# Patient Record
Sex: Male | Born: 1975
Health system: Southern US, Community
[De-identification: ages and names within clinical notes are randomized; demographics above are authoritative.]

## PROBLEM LIST (undated history)

## (undated) DIAGNOSIS — K30 Functional dyspepsia: Secondary | ICD-10-CM

## (undated) DIAGNOSIS — F419 Anxiety disorder, unspecified: Secondary | ICD-10-CM

## (undated) DIAGNOSIS — S0460XA Injury of acoustic nerve, unspecified side, initial encounter: Secondary | ICD-10-CM

## (undated) DIAGNOSIS — L719 Rosacea, unspecified: Secondary | ICD-10-CM

## (undated) DIAGNOSIS — K580 Irritable bowel syndrome with diarrhea: Secondary | ICD-10-CM

## (undated) DIAGNOSIS — H8120 Vestibular neuronitis, unspecified ear: Secondary | ICD-10-CM

## (undated) DIAGNOSIS — J309 Allergic rhinitis, unspecified: Secondary | ICD-10-CM

## (undated) DIAGNOSIS — K760 Fatty (change of) liver, not elsewhere classified: Secondary | ICD-10-CM

## (undated) DIAGNOSIS — L709 Acne, unspecified: Secondary | ICD-10-CM

## (undated) DIAGNOSIS — E785 Hyperlipidemia, unspecified: Secondary | ICD-10-CM

## (undated) HISTORY — DX: Functional dyspepsia: K30

## (undated) HISTORY — DX: Acne, unspecified: L70.9

## (undated) HISTORY — DX: Injury of acoustic nerve, unspecified side, initial encounter: S04.60XA

## (undated) HISTORY — PX: TONSILLECTOMY: SUR1361

## (undated) HISTORY — DX: Hyperlipidemia, unspecified: E78.5

## (undated) HISTORY — DX: Allergic rhinitis, unspecified: J30.9

## (undated) HISTORY — DX: Irritable bowel syndrome with diarrhea: K58.0

## (undated) HISTORY — DX: Fatty (change of) liver, not elsewhere classified: K76.0

## (undated) HISTORY — DX: Rosacea, unspecified: L71.9

## (undated) HISTORY — DX: Vestibular neuronitis, unspecified ear: H81.20

## (undated) HISTORY — DX: Anxiety disorder, unspecified: F41.9

---

## 2007-05-09 ENCOUNTER — Ambulatory Visit: Payer: Self-pay | Admitting: Gastroenterology

## 2007-05-09 LAB — CONVERTED CEMR LAB
ALT: 91 units/L — ABNORMAL HIGH (ref 0–40)
AST: 41 units/L — ABNORMAL HIGH (ref 0–37)
Albumin: 4.4 g/dL (ref 3.5–5.2)
Alkaline Phosphatase: 53 units/L (ref 39–117)
Anti Nuclear Antibody(ANA): NEGATIVE
Bilirubin, Direct: 0.1 mg/dL (ref 0.0–0.3)
Ceruloplasmin: 34 mg/dL (ref 21–63)
Ferritin: 206.1 ng/mL (ref 22.0–322.0)
HCV Ab: NEGATIVE
Hep B S Ab: NEGATIVE
Hepatitis B Surface Ag: NEGATIVE
INR: 1.2 (ref 0.9–2.0)
Iron: 69 ug/dL (ref 42–165)
Prothrombin Time: 13.3 s (ref 10.0–14.0)
Total Bilirubin: 0.6 mg/dL (ref 0.3–1.2)
Total Protein: 7.7 g/dL (ref 6.0–8.3)
Transferrin: 252.5 mg/dL (ref >=212.0)

## 2007-05-10 ENCOUNTER — Ambulatory Visit: Payer: Self-pay | Admitting: Gastroenterology

## 2007-06-07 ENCOUNTER — Ambulatory Visit: Payer: Self-pay | Admitting: Gastroenterology

## 2008-05-10 DIAGNOSIS — F410 Panic disorder [episodic paroxysmal anxiety] without agoraphobia: Secondary | ICD-10-CM | POA: Insufficient documentation

## 2008-05-10 DIAGNOSIS — J309 Allergic rhinitis, unspecified: Secondary | ICD-10-CM | POA: Insufficient documentation

## 2008-05-10 DIAGNOSIS — E782 Mixed hyperlipidemia: Secondary | ICD-10-CM | POA: Insufficient documentation

## 2011-05-11 NOTE — Assessment & Plan Note (Signed)
[facility]                         GASTROENTEROLOGY OFFICE NOTE   Divante, Kotch Rosetta Posner                       MRN:          [id number]  DATE:06/07/2007                            DOB:          [birth year]    Mr. Conover returns for followup on fatty liver and abnormal liver function  tests. He saw Dr. Theresia Lo on May 30 and a repeat hepatic function panel  showed an AST of 56 and an ALT of 113 with the remainder of the liver  function tests being normal. This is in a similary range as his prior  liver function tests. We reviewed the results of his recent ultrasound  compared to his prior ultrasound and the results of all his blood work  performed.   CURRENT MEDICATIONS:  Listed on the chart - updated and reviewed.   MEDICATION ALLERGIES:  None known.   PHYSICAL EXAMINATION:  GENERAL:  Overweight, white male in no acute  distress.  VITAL SIGNS:  Weight 231 pounds, blood pressure is 110/72, pulse 84 and  regular. He is not reexamined.   ASSESSMENT/PLAN:  Abnormal transaminases with moderate to severely  increased hepatic echodensity and mild hepatomegaly noted on his recent  ultrasound. These findings are all compatible with fatty infiltration of  the liver. Medication side effects would be much less likely. Other  infiltrative diseases of the liver are possible but less likely. He is  very concerned that a diagnosis has not been adequately established and  he would like a second opinion at [facility] and I encouraged him to proceed with a second opinion. I have  offered him the option of proceeding with a percutaneous ultrasound-  guided liver biopsy to further assess his liver disease and at this  point he would like to proceed with the second opinion. I will await the  results of his consultation at [facility].     Venita Lick. Russella Dar, MD, [facility]  Electronically Signed    MTS/MedQ  DD:  06/07/2007  DT: 06/07/2007  Job #: [id number]   cc:   Vikki Ports, M.D.

## 2011-05-11 NOTE — Assessment & Plan Note (Signed)
[facility]                         GASTROENTEROLOGY OFFICE NOTE   JAMEY, HARMAN                       MRN:          [id number]  DATE:05/09/2007                            DOB:          [birth year]    REFERRING PHYSICIAN:  Vikki Ports, M.D.   REASON FOR REFERRAL:  Abnormal liver function tests.   HISTORY OF PRESENT ILLNESS:  Mr. Tsutsui is a 35 year old white male whom I  previously evaluated in 2001 and 2003.  Fatty infiltration of the liver  as well as mild hepatomegaly was noted on ultrasound imaging in 2001.  The liver measured 19 cm and a diffuse increase in hepatic echodensity  was noted.  Serologies for chronic viral hepatitis and metabolic liver  diseases were all negative.  He was overweight  and he was recommended  to begin a long-term weight loss program supervised by Dr. Smith Mince.  He has recently transferred his care to Dr. Theresia Lo, who has noted  persistently elevated liver function tests.  Blood work from March 12  showed an AST of 50 and an ALT of 122.  His indirect bilirubin was  elevated at 1.1.  Total cholesterol at 218.  Repeat blood work from  April 22 shows an AST of 55, and an ALT of 116.  There is no family  history of liver disease and he denies any exposure to patients with  known hepatitis or jaundice.  He denies tattoos, intravenous drug usage,  inadvertant needle sticks, or blood transfusions.  He states he has  started on medications for hyperlipidemia and his cholesterol has  improved.  He states his weight has been essentially stable over the  past several years.   FAMILY HISTORY:  Remarkable for colon polyps in his father.  No other  family members with colon polyps, colon cancer, or inflammatory bowel  disease.   PAST MEDICAL HISTORY:  Hyperlipidemia, panic disorder, seasonal  rhinitis, status post wisdom tooth extraction, fatty infiltration of the  liver.   CURRENT MEDICATIONS:  Listed on the chart,  updated, and reviewed.   MEDICATION ALLERGIES:  None known.   SOCIAL HISTORY AND REVIEW OF SYSTEMS:  Per the hand-written form.   PHYSICAL EXAM:  Well-developed, well-nourished overweight white male in  no acute distress.  Height 5 feet 7 inches, weight 232 pounds, blood pressure 120/82, pulse  72 and regular.  HEENT:  Anicteric sclerae.  Oropharynx clear.  CHEST:  Clear to auscultation bilaterally.  CARDIAC:  Regular rate and rhythm without murmurs appreciated.  ABDOMEN:  Soft, nontender, nondistended.  Normoactive bowel sounds.  No  palpable organomegaly, masses, or hernias.  The liver span is  approximately 12-13 cm by percussion in the right mid clavicular line.  NEUROLOGIC:  Alert and oriented x3.  Grossly nonfocal.   ASSESSMENT AND PLAN:  1. Abnormal transaminases likley secondary to known hepatic steatosis.      Will schedule an abdominal ultrasound and repeat all of his      serologic studies to reassess his liver disease.  Consider a liver      biopsy for further evaluation.  If  hepatic steatosis is again      confirmed, he should begin a long-term weight loss program with      ongoing management of his hyperlipidemia per Dr. Theresia Lo.  Return      office visit in 1 month.     Venita Lick. Russella Dar, MD, [facility]  Electronically Signed    MTS/MedQ  DD: 05/09/2007  DT: 05/09/2007  Job #: [id number]   cc:   Vikki Ports, M.D.

## 2012-09-22 ENCOUNTER — Encounter: Payer: Self-pay | Admitting: Gastroenterology

## 2013-03-30 ENCOUNTER — Encounter: Payer: Self-pay | Admitting: Gastroenterology

## 2014-02-19 DIAGNOSIS — N62 Hypertrophy of breast: Secondary | ICD-10-CM | POA: Insufficient documentation

## 2014-02-19 DIAGNOSIS — E230 Hypopituitarism: Secondary | ICD-10-CM | POA: Insufficient documentation

## 2014-02-19 DIAGNOSIS — T50905A Adverse effect of unspecified drugs, medicaments and biological substances, initial encounter: Secondary | ICD-10-CM | POA: Insufficient documentation

## 2014-06-07 DIAGNOSIS — L719 Rosacea, unspecified: Secondary | ICD-10-CM | POA: Insufficient documentation

## 2014-06-07 HISTORY — DX: Rosacea, unspecified: L71.9

## 2014-10-21 DIAGNOSIS — Z6835 Body mass index (BMI) 35.0-35.9, adult: Secondary | ICD-10-CM

## 2014-10-21 DIAGNOSIS — E6609 Other obesity due to excess calories: Secondary | ICD-10-CM | POA: Insufficient documentation

## 2016-09-27 DIAGNOSIS — M791 Myalgia: Secondary | ICD-10-CM | POA: Diagnosis not present

## 2016-09-27 DIAGNOSIS — M7542 Impingement syndrome of left shoulder: Secondary | ICD-10-CM | POA: Diagnosis not present

## 2016-09-27 DIAGNOSIS — M25512 Pain in left shoulder: Secondary | ICD-10-CM | POA: Diagnosis not present

## 2016-10-08 DIAGNOSIS — M25512 Pain in left shoulder: Secondary | ICD-10-CM | POA: Diagnosis not present

## 2016-10-08 DIAGNOSIS — M7542 Impingement syndrome of left shoulder: Secondary | ICD-10-CM | POA: Diagnosis not present

## 2016-10-08 DIAGNOSIS — M791 Myalgia: Secondary | ICD-10-CM | POA: Diagnosis not present

## 2016-10-19 DIAGNOSIS — Z6835 Body mass index (BMI) 35.0-35.9, adult: Secondary | ICD-10-CM | POA: Diagnosis not present

## 2016-10-19 DIAGNOSIS — G4733 Obstructive sleep apnea (adult) (pediatric): Secondary | ICD-10-CM | POA: Diagnosis not present

## 2016-10-19 DIAGNOSIS — E6609 Other obesity due to excess calories: Secondary | ICD-10-CM | POA: Diagnosis not present

## 2016-10-20 DIAGNOSIS — D1801 Hemangioma of skin and subcutaneous tissue: Secondary | ICD-10-CM | POA: Diagnosis not present

## 2016-10-20 DIAGNOSIS — B001 Herpesviral vesicular dermatitis: Secondary | ICD-10-CM | POA: Diagnosis not present

## 2016-10-20 DIAGNOSIS — L718 Other rosacea: Secondary | ICD-10-CM | POA: Diagnosis not present

## 2016-10-20 DIAGNOSIS — D485 Neoplasm of uncertain behavior of skin: Secondary | ICD-10-CM | POA: Diagnosis not present

## 2017-01-03 DIAGNOSIS — G4733 Obstructive sleep apnea (adult) (pediatric): Secondary | ICD-10-CM | POA: Diagnosis not present

## 2017-01-10 DIAGNOSIS — R05 Cough: Secondary | ICD-10-CM | POA: Diagnosis not present

## 2017-01-10 DIAGNOSIS — J111 Influenza due to unidentified influenza virus with other respiratory manifestations: Secondary | ICD-10-CM | POA: Diagnosis not present

## 2017-02-07 DIAGNOSIS — E349 Endocrine disorder, unspecified: Secondary | ICD-10-CM | POA: Diagnosis not present

## 2017-02-07 DIAGNOSIS — E221 Hyperprolactinemia: Secondary | ICD-10-CM | POA: Diagnosis not present

## 2017-02-07 DIAGNOSIS — K760 Fatty (change of) liver, not elsewhere classified: Secondary | ICD-10-CM | POA: Diagnosis not present

## 2017-02-07 DIAGNOSIS — E782 Mixed hyperlipidemia: Secondary | ICD-10-CM | POA: Diagnosis not present

## 2017-02-25 DIAGNOSIS — E782 Mixed hyperlipidemia: Secondary | ICD-10-CM | POA: Diagnosis not present

## 2017-02-25 DIAGNOSIS — G4733 Obstructive sleep apnea (adult) (pediatric): Secondary | ICD-10-CM | POA: Diagnosis not present

## 2017-02-25 DIAGNOSIS — Z0000 Encounter for general adult medical examination without abnormal findings: Secondary | ICD-10-CM | POA: Diagnosis not present

## 2017-02-25 DIAGNOSIS — E291 Testicular hypofunction: Secondary | ICD-10-CM | POA: Diagnosis not present

## 2017-05-13 DIAGNOSIS — J301 Allergic rhinitis due to pollen: Secondary | ICD-10-CM | POA: Diagnosis not present

## 2017-05-13 DIAGNOSIS — J3089 Other allergic rhinitis: Secondary | ICD-10-CM | POA: Diagnosis not present

## 2017-05-19 DIAGNOSIS — Z202 Contact with and (suspected) exposure to infections with a predominantly sexual mode of transmission: Secondary | ICD-10-CM | POA: Diagnosis not present

## 2017-05-21 DIAGNOSIS — R9431 Abnormal electrocardiogram [ECG] [EKG]: Secondary | ICD-10-CM | POA: Diagnosis not present

## 2017-05-21 DIAGNOSIS — I200 Unstable angina: Secondary | ICD-10-CM | POA: Diagnosis not present

## 2017-05-21 DIAGNOSIS — I499 Cardiac arrhythmia, unspecified: Secondary | ICD-10-CM | POA: Diagnosis not present

## 2017-05-21 DIAGNOSIS — F419 Anxiety disorder, unspecified: Secondary | ICD-10-CM | POA: Diagnosis not present

## 2017-05-21 DIAGNOSIS — R079 Chest pain, unspecified: Secondary | ICD-10-CM | POA: Diagnosis not present

## 2017-05-22 DIAGNOSIS — I517 Cardiomegaly: Secondary | ICD-10-CM | POA: Diagnosis not present

## 2017-05-22 DIAGNOSIS — F411 Generalized anxiety disorder: Secondary | ICD-10-CM | POA: Diagnosis not present

## 2017-05-24 DIAGNOSIS — I200 Unstable angina: Secondary | ICD-10-CM | POA: Diagnosis not present

## 2017-05-24 DIAGNOSIS — F430 Acute stress reaction: Secondary | ICD-10-CM | POA: Diagnosis not present

## 2017-05-24 DIAGNOSIS — F411 Generalized anxiety disorder: Secondary | ICD-10-CM | POA: Diagnosis not present

## 2017-05-24 DIAGNOSIS — R0789 Other chest pain: Secondary | ICD-10-CM | POA: Diagnosis not present

## 2017-08-02 DIAGNOSIS — E782 Mixed hyperlipidemia: Secondary | ICD-10-CM | POA: Diagnosis not present

## 2017-08-02 DIAGNOSIS — R079 Chest pain, unspecified: Secondary | ICD-10-CM | POA: Diagnosis not present

## 2017-08-02 DIAGNOSIS — I200 Unstable angina: Secondary | ICD-10-CM | POA: Diagnosis not present

## 2017-08-02 DIAGNOSIS — R55 Syncope and collapse: Secondary | ICD-10-CM | POA: Diagnosis not present

## 2017-09-22 DIAGNOSIS — R079 Chest pain, unspecified: Secondary | ICD-10-CM | POA: Diagnosis not present

## 2017-09-22 DIAGNOSIS — K219 Gastro-esophageal reflux disease without esophagitis: Secondary | ICD-10-CM | POA: Diagnosis not present

## 2017-09-22 DIAGNOSIS — K760 Fatty (change of) liver, not elsewhere classified: Secondary | ICD-10-CM | POA: Diagnosis not present

## 2017-09-22 DIAGNOSIS — R7989 Other specified abnormal findings of blood chemistry: Secondary | ICD-10-CM | POA: Diagnosis not present

## 2017-09-22 DIAGNOSIS — I200 Unstable angina: Secondary | ICD-10-CM | POA: Diagnosis not present

## 2017-10-05 DIAGNOSIS — R194 Change in bowel habit: Secondary | ICD-10-CM | POA: Diagnosis not present

## 2017-10-05 DIAGNOSIS — R079 Chest pain, unspecified: Secondary | ICD-10-CM | POA: Diagnosis not present

## 2017-10-13 DIAGNOSIS — H8309 Labyrinthitis, unspecified ear: Secondary | ICD-10-CM | POA: Diagnosis not present

## 2017-10-13 DIAGNOSIS — H8120 Vestibular neuronitis, unspecified ear: Secondary | ICD-10-CM

## 2017-10-13 DIAGNOSIS — R42 Dizziness and giddiness: Secondary | ICD-10-CM | POA: Diagnosis not present

## 2017-10-13 DIAGNOSIS — I200 Unstable angina: Secondary | ICD-10-CM | POA: Diagnosis not present

## 2017-10-13 HISTORY — DX: Vestibular neuronitis, unspecified ear: H81.20

## 2017-10-17 DIAGNOSIS — R42 Dizziness and giddiness: Secondary | ICD-10-CM | POA: Diagnosis not present

## 2017-10-17 DIAGNOSIS — H903 Sensorineural hearing loss, bilateral: Secondary | ICD-10-CM | POA: Diagnosis not present

## 2017-10-17 DIAGNOSIS — H9313 Tinnitus, bilateral: Secondary | ICD-10-CM | POA: Diagnosis not present

## 2017-10-17 DIAGNOSIS — H8309 Labyrinthitis, unspecified ear: Secondary | ICD-10-CM | POA: Diagnosis not present

## 2017-11-03 DIAGNOSIS — Z23 Encounter for immunization: Secondary | ICD-10-CM | POA: Diagnosis not present

## 2017-11-09 DIAGNOSIS — H8309 Labyrinthitis, unspecified ear: Secondary | ICD-10-CM | POA: Diagnosis not present

## 2017-11-21 DIAGNOSIS — R51 Headache: Secondary | ICD-10-CM | POA: Diagnosis not present

## 2017-11-21 DIAGNOSIS — R42 Dizziness and giddiness: Secondary | ICD-10-CM | POA: Diagnosis not present

## 2017-11-21 DIAGNOSIS — R112 Nausea with vomiting, unspecified: Secondary | ICD-10-CM | POA: Diagnosis not present

## 2017-11-21 DIAGNOSIS — R1013 Epigastric pain: Secondary | ICD-10-CM | POA: Diagnosis not present

## 2017-11-21 DIAGNOSIS — J329 Chronic sinusitis, unspecified: Secondary | ICD-10-CM | POA: Diagnosis not present

## 2017-11-21 DIAGNOSIS — I200 Unstable angina: Secondary | ICD-10-CM | POA: Diagnosis not present

## 2017-11-21 DIAGNOSIS — K30 Functional dyspepsia: Secondary | ICD-10-CM | POA: Diagnosis not present

## 2017-11-24 DIAGNOSIS — R42 Dizziness and giddiness: Secondary | ICD-10-CM | POA: Diagnosis not present

## 2017-12-01 DIAGNOSIS — H81391 Other peripheral vertigo, right ear: Secondary | ICD-10-CM | POA: Diagnosis not present

## 2017-12-12 DIAGNOSIS — R42 Dizziness and giddiness: Secondary | ICD-10-CM | POA: Diagnosis not present

## 2017-12-12 DIAGNOSIS — H832X1 Labyrinthine dysfunction, right ear: Secondary | ICD-10-CM | POA: Diagnosis not present

## 2017-12-22 DIAGNOSIS — H832X1 Labyrinthine dysfunction, right ear: Secondary | ICD-10-CM | POA: Diagnosis not present

## 2017-12-22 DIAGNOSIS — N62 Hypertrophy of breast: Secondary | ICD-10-CM | POA: Diagnosis not present

## 2017-12-22 DIAGNOSIS — I200 Unstable angina: Secondary | ICD-10-CM | POA: Diagnosis not present

## 2017-12-22 DIAGNOSIS — E349 Endocrine disorder, unspecified: Secondary | ICD-10-CM | POA: Diagnosis not present

## 2018-01-09 ENCOUNTER — Other Ambulatory Visit: Payer: Self-pay | Admitting: *Deleted

## 2018-01-09 ENCOUNTER — Encounter: Payer: Self-pay | Admitting: *Deleted

## 2018-01-10 ENCOUNTER — Encounter: Payer: Self-pay | Admitting: Family Medicine

## 2018-01-10 ENCOUNTER — Ambulatory Visit: Payer: BLUE CROSS/BLUE SHIELD | Admitting: Family Medicine

## 2018-01-10 VITALS — BP 130/80 | HR 61 | Temp 98.3°F | Ht 67.0 in | Wt 236.2 lb

## 2018-01-10 DIAGNOSIS — S0460XS Injury of acoustic nerve, unspecified side, sequela: Secondary | ICD-10-CM | POA: Diagnosis not present

## 2018-01-10 DIAGNOSIS — K30 Functional dyspepsia: Secondary | ICD-10-CM

## 2018-01-10 DIAGNOSIS — H8301 Labyrinthitis, right ear: Secondary | ICD-10-CM

## 2018-01-10 DIAGNOSIS — F411 Generalized anxiety disorder: Secondary | ICD-10-CM | POA: Diagnosis not present

## 2018-01-10 DIAGNOSIS — R0789 Other chest pain: Secondary | ICD-10-CM

## 2018-01-10 DIAGNOSIS — E6609 Other obesity due to excess calories: Secondary | ICD-10-CM

## 2018-01-10 DIAGNOSIS — Z8719 Personal history of other diseases of the digestive system: Secondary | ICD-10-CM | POA: Insufficient documentation

## 2018-01-10 DIAGNOSIS — S0460XA Injury of acoustic nerve, unspecified side, initial encounter: Secondary | ICD-10-CM | POA: Insufficient documentation

## 2018-01-10 DIAGNOSIS — E782 Mixed hyperlipidemia: Secondary | ICD-10-CM | POA: Diagnosis not present

## 2018-01-10 DIAGNOSIS — G4733 Obstructive sleep apnea (adult) (pediatric): Secondary | ICD-10-CM

## 2018-01-10 DIAGNOSIS — N62 Hypertrophy of breast: Secondary | ICD-10-CM | POA: Diagnosis not present

## 2018-01-10 DIAGNOSIS — T50905A Adverse effect of unspecified drugs, medicaments and biological substances, initial encounter: Secondary | ICD-10-CM

## 2018-01-10 DIAGNOSIS — E230 Hypopituitarism: Secondary | ICD-10-CM | POA: Diagnosis not present

## 2018-01-10 DIAGNOSIS — Z6835 Body mass index (BMI) 35.0-35.9, adult: Secondary | ICD-10-CM

## 2018-01-10 HISTORY — DX: Functional dyspepsia: K30

## 2018-01-10 HISTORY — DX: Injury of acoustic nerve, unspecified side, initial encounter: S04.60XA

## 2018-01-10 MED ORDER — KETOCONAZOLE 2 % EX SHAM: MEDICATED_SHAMPOO | CUTANEOUS | 3 refills | Status: DC

## 2018-01-10 MED ORDER — PAROXETINE HCL 20 MG PO TABS
20.0000 mg | ORAL_TABLET | Freq: Every morning | ORAL | 3 refills | Status: DC
Start: 2018-01-10 — End: 2019-01-05

## 2018-01-10 MED ORDER — MINOCYCLINE HCL 100 MG PO CAPS: ORAL_CAPSULE | ORAL | 2 refills | Status: DC

## 2018-01-10 MED ORDER — CLONAZEPAM 0.5 MG PO TABS: 0.5000 mg | ORAL_TABLET | Freq: Two times a day (BID) | ORAL | 3 refills | Status: DC | PRN

## 2018-01-10 NOTE — Progress Notes (Signed)
Subjective  CC:  Chief Complaint  Patient presents with  . Establish Care    Transfer from [place]  . Hyperlipidemia  . Anxiety    HPI: Shannon Ibarra is a 42 y.o. male who presents to [place] at [facility] today to establish care with me as a new patient.  He is a former [facility] patient and is here to reestablish care with me today.   He has the following concerns or needs:   I reviewed notes from care everywhere: since my last visit, he has had a coronary cath with clean arteries and nl EF on Echocardiogram for eval for noncardiac chest pain, and after a 7 sec pause and syncopal event after the NTG given in ER. CP is stress induced and GI related, now resolved on nexium. He has f/u with GI.   Anxiety: chronic negative work stress due to antagonistic culture. Stable on paxil. Agressiveness was worse on testosterone. Weaning.  Hypogonadism: weaning testosterone due to painful gynecomastia. Will stop in 6 months. Mild fatigue persists  Acute neuritis is resolved but has damage to vestibular nerve; still limited by vertigo; exercises and hobbies are thus limited: no longer able to play bb, enjoy driving etc. Evaluated at [facility]: will need referral for PT there.   Acne/rosacea: come and go. Needs refills for prn use.  H/o elevated lipids; stable when last checked in march. No meds.   We updated and reviewed the patient's past history in detail and it is documented below.  Patient Active Problem List   Diagnosis Date Noted  . GAD (generalized anxiety disorder) 01/10/2018    Priority: High  . Vestibular nerve damage 01/10/2018    Priority: High  . Obstructive sleep apnea syndrome, severe 10/19/2016    Priority: High  . History of fatty infiltration of liver 01/10/2018    Priority: Medium  . Class 2 obesity due to excess calories without serious comorbidity with body mass index (BMI) of 35.0 to 35.9 in adult 10/21/2014    Priority: Medium  .  Hypogonadotropic hypogonadism in male [facility]) 02/19/2014    Priority: Medium  . Acne rosacea 06/07/2014    Priority: Low  . Drug-induced gynecomastia 02/19/2014    Priority: Low  . Allergic rhinitis 05/10/2008    Priority: Low  . Labyrinthitis, right 01/10/2018  . Non-cardiac chest pain 01/10/2018  . Nonulcer dyspepsia 01/10/2018  . Mixed hyperlipidemia 05/10/2008   Health Maintenance  Topic Date Due  . HIV Screening  01/08/1990  . TETANUS/TDAP  12/06/2023  . INFLUENZA VACCINE  Completed   Immunization History  Administered Date(s) Administered  . Influenza Split 10/17/2015  . Influenza,inj,Quad PF,6+ Mos 11/03/2017  . Td 12/27/2002  . Tdap 12/05/2013   Current Meds  Medication Sig  . clonazePAM (KLONOPIN) 0.5 MG tablet Take 1 tablet (0.5 mg total) by mouth 2 (two) times daily as needed for anxiety.  Marland Kitchen esomeprazole (NEXIUM) 40 MG capsule Take by mouth daily.   Derrill Memo ON 01/12/2018] ketoconazole (NIZORAL) 2 % shampoo Apply topically 2 (two) times a week. As needed  . minocycline (MINOCIN,DYNACIN) 100 MG capsule TAKE 1 CAPSULE TWICE A DAY PRN  . PARoxetine (PAXIL) 20 MG tablet Take 1 tablet (20 mg total) by mouth every morning.  . tamoxifen (NOLVADEX) 10 MG tablet Take 10 mg by mouth daily.  Marland Kitchen testosterone cypionate (DEPOTESTOSTERONE CYPIONATE) 200 MG/ML injection Inject 100 mg into the muscle every 30 (thirty) days.  . [DISCONTINUED] clonazePAM (KLONOPIN) 0.5 MG tablet  Take 0.5 mg by mouth 2 (two) times daily as needed for anxiety.  . [DISCONTINUED] ketoconazole (NIZORAL) 2 % shampoo Apply topically.  . [DISCONTINUED] minocycline (MINOCIN,DYNACIN) 100 MG capsule TAKE 1 CAPSULE TWICE A DAY PRN  . [DISCONTINUED] PARoxetine (PAXIL) 20 MG tablet TAKE 1 TABLET EVERY MORNING    Allergies: Patient is allergic to nitroglycerin; bupropion; and dust mite extract. Past Medical History Patient  has a past medical history of Acne, Allergic rhinitis, Anxiety, Fatty liver, Hyperlipidemia  (11/2282013), Nonulcer dyspepsia (01/10/2018), Rosacea, acne (06/07/2014), Vestibular nerve damage (01/10/2018), and Vestibular neuritis (10/13/2017). Past Surgical History Patient  has a past surgical history that includes Tonsillectomy. Family History: Patient family history includes Colon polyps in his father; Hypertension in his brother and mother; Throat cancer in his father. Social History:  Patient  reports that he quit smoking about 10 years ago. His smoking use included cigarettes. He has a 12.00 pack-year smoking history. he has never used smokeless tobacco. He reports that he drinks about 1.2 oz of alcohol per week. He reports that he does not use drugs.  Review of Systems: Constitutional: negative for fever or malaise Ophthalmic: negative for photophobia, double vision or loss of vision Cardiovascular: negative for chest pain, dyspnea on exertion, or new LE swelling Respiratory: negative for SOB or persistent cough Gastrointestinal: negative for abdominal pain, change in bowel habits or melena Genitourinary: negative for dysuria or gross hematuria Musculoskeletal: negative for new gait disturbance or muscular weakness Integumentary: negative for new or persistent rashes Neurological: negative for TIA or stroke symptoms Psychiatric: negative for SI or delusions Allergic/Immunologic: negative for hives  Patient Care Team    Relationship Specialty Notifications Start End  Leamon Arnt, MD PCP - General Family Medicine  01/10/18   Janie Morning, MD  Gastroenterology  01/10/18   Susette Racer, MD Consulting Physician Endocrinology  01/10/18   Dionne Milo, MD Consulting Physician Pulmonary Disease  01/10/18     Objective  Vitals: BP 130/80 (BP Location: Left Arm, Patient Position: Sitting, Cuff Size: Large)   Pulse 61   Temp 98.3 F (36.8 C) (Oral)   Ht 5' 7"  (1.702 m)   Wt 236 lb 4 oz (107.2 kg)   SpO2 96%   BMI 37.00 kg/m  General:  Well developed, well  nourished, no acute distress  Psych:  Alert and oriented,normal mood and affect Cardiovascular:  RRR without gallop, rub or murmur, nondisplaced PMI Respiratory:  Good breath sounds bilaterally, CTAB with normal respiratory effort MSK: no deformities, contusions. Joints are without erythema or swelling Skin:  Warm, no rashes or suspicious lesions noted Neurologic:    Mental status is normal. Gross motor and sensory exams are normal. Normal gait  Assessment  1. GAD (generalized anxiety disorder)   2. Mixed hyperlipidemia   3. Drug-induced gynecomastia   4. Hypogonadotropic hypogonadism in male [facility])   5. Obstructive sleep apnea syndrome, severe   6. Class 2 obesity due to excess calories without serious comorbidity with body mass index (BMI) of 35.0 to 35.9 in adult   7. Non-cardiac chest pain   8. Vestibular nerve damage, unspecified laterality, sequela   9. Nonulcer dyspepsia      Plan   Counseling done on behavioral management of stress, work related. Refilled meds. Stable on prn klonopin and paxil for years; managed by psych and therapist in past.   Refilled acne/rosacea meds.   F/u with endocrine in 6 months - weaning testosterone.   Working on weight loss.  Continue PPI and f/u with GI.   Follow up:  Return in about 6 months (around 07/10/2018) for complete physical.  Commons side effects, risks, benefits, and alternatives for medications and treatment plan prescribed today were discussed, and the patient expressed understanding of the given instructions. Patient is instructed to call or message via MyChart if he/she has any questions or concerns regarding our treatment plan. No barriers to understanding were identified. We discussed Red Flag symptoms and signs in detail. Patient expressed understanding regarding what to do in case of urgent or emergency type symptoms.   Medication list was reconciled, printed and provided to the patient in AVS. Patient instructions and  summary information was reviewed with the patient as documented in the AVS. This note was prepared with assistance of Dragon voice recognition software. Occasional wrong-word or sound-a-like substitutions may have occurred due to the inherent limitations of voice recognition software  No orders of the defined types were placed in this encounter.  Meds ordered this encounter  Medications  . PARoxetine (PAXIL) 20 MG tablet    Sig: Take 1 tablet (20 mg total) by mouth every morning.    Dispense:  90 tablet    Refill:  3  . clonazePAM (KLONOPIN) 0.5 MG tablet    Sig: Take 1 tablet (0.5 mg total) by mouth 2 (two) times daily as needed for anxiety.    Dispense:  60 tablet    Refill:  3  . minocycline (MINOCIN,DYNACIN) 100 MG capsule    Sig: TAKE 1 CAPSULE TWICE A DAY PRN    Dispense:  60 capsule    Refill:  2  . ketoconazole (NIZORAL) 2 % shampoo    Sig: Apply topically 2 (two) times a week. As needed    Dispense:  120 mL    Refill:  3

## 2018-01-10 NOTE — Patient Instructions (Signed)
It was so good seeing you again! Thank you for establishing with my new practice and allowing me to continue caring for you. It means a lot to me.   Please schedule a follow up appointment with me in 6 months for your complete physical, please come fasting.    Stress and Stress Management Stress is a normal reaction to life events. It is what you feel when life demands more than you are used to or more than you can handle. Some stress can be useful. For example, the stress reaction can help you catch the last bus of the day, study for a test, or meet a deadline at work. But stress that occurs too often or for too long can cause problems. It can affect your emotional health and interfere with relationships and normal daily activities. Too much stress can weaken your immune system and increase your risk for physical illness. If you already have a medical problem, stress can make it worse. What are the causes? All sorts of life events may cause stress. An event that causes stress for one person may not be stressful for another person. Major life events commonly cause stress. These may be positive or negative. Examples include losing your job, moving into a new home, getting married, having a baby, or losing a loved one. Less obvious life events may also cause stress, especially if they occur day after day or in combination. Examples include working long hours, driving in traffic, caring for children, being in debt, or being in a difficult relationship. What are the signs or symptoms? Stress may cause emotional symptoms including, the following:  Anxiety. This is feeling worried, afraid, on edge, overwhelmed, or out of control.  Anger. This is feeling irritated or impatient.  Depression. This is feeling sad, down, helpless, or guilty.  Difficulty focusing, remembering, or making decisions.  Stress may cause physical symptoms, including the following:  Aches and pains. These may affect your head,  neck, back, stomach, or other areas of your body.  Tight muscles or clenched jaw.  Low energy or trouble sleeping.  Stress may cause unhealthy behaviors, including the following:  Eating to feel better (overeating) or skipping meals.  Sleeping too little, too much, or both.  Working too much or putting off tasks (procrastination).  Smoking, drinking alcohol, or using drugs to feel better.  How is this diagnosed? Stress is diagnosed through an assessment by your health care provider. Your health care provider will ask questions about your symptoms and any stressful life events.Your health care provider will also ask about your medical history and may order blood tests or other tests. Certain medical conditions and medicine can cause physical symptoms similar to stress. Mental illness can cause emotional symptoms and unhealthy behaviors similar to stress. Your health care provider may refer you to a mental health professional for further evaluation. How is this treated? Stress management is the recommended treatment for stress.The goals of stress management are reducing stressful life events and coping with stress in healthy ways. Techniques for reducing stressful life events include the following:  Stress identification. Self-monitor for stress and identify what causes stress for you. These skills may help you to avoid some stressful events.  Time management. Set your priorities, keep a calendar of events, and learn to say "no." These tools can help you avoid making too many commitments.  Techniques for coping with stress include the following:  Rethinking the problem. Try to think realistically about stressful events rather than ignoring  them or overreacting. Try to find the positives in a stressful situation rather than focusing on the negatives.  Exercise. Physical exercise can release both physical and emotional tension. The key is to find a form of exercise you enjoy and do it  regularly.  Relaxation techniques. These relax the body and mind. Examples include yoga, meditation, tai chi, biofeedback, deep breathing, progressive muscle relaxation, listening to music, being out in nature, journaling, and other hobbies. Again, the key is to find one or more that you enjoy and can do regularly.  Healthy lifestyle. Eat a balanced diet, get plenty of sleep, and do not smoke. Avoid using alcohol or drugs to relax.  Strong support network. Spend time with family, friends, or other people you enjoy being around.Express your feelings and talk things over with someone you trust.  Counseling or talktherapy with a mental health professional may be helpful if you are having difficulty managing stress on your own. Medicine is typically not recommended for the treatment of stress.Talk to your health care provider if you think you need medicine for symptoms of stress. Follow these instructions at home:  Keep all follow-up visits as directed by your health care provider.  Take all medicines as directed by your health care provider. Contact a health care provider if:  Your symptoms get worse or you start having new symptoms.  You feel overwhelmed by your problems and can no longer manage them on your own. Get help right away if:  You feel like hurting yourself or someone else. This information is not intended to replace advice given to you by your health care provider. Make sure you discuss any questions you have with your health care provider. Document Released: 06/08/2001 Document Revised: 05/20/2016 Document Reviewed: 08/07/2013 Elsevier Interactive Patient Education  2017 [employer].

## 2018-01-11 DIAGNOSIS — L700 Acne vulgaris: Secondary | ICD-10-CM | POA: Diagnosis not present

## 2018-01-11 DIAGNOSIS — L738 Other specified follicular disorders: Secondary | ICD-10-CM | POA: Diagnosis not present

## 2018-01-11 DIAGNOSIS — B078 Other viral warts: Secondary | ICD-10-CM | POA: Diagnosis not present

## 2018-01-11 DIAGNOSIS — L718 Other rosacea: Secondary | ICD-10-CM | POA: Diagnosis not present

## 2018-01-30 ENCOUNTER — Telehealth: Payer: Self-pay | Admitting: Family Medicine

## 2018-01-30 NOTE — Telephone Encounter (Signed)
Spoke to Patient and he states that Einar Pheasant is a Community education officer at [facility].

## 2018-01-30 NOTE — Telephone Encounter (Signed)
LM for Patient to call office Back.

## 2018-01-30 NOTE — Telephone Encounter (Signed)
Please see his unread Mychart message and then call him to get information so that I may place the referral.  Thanks.

## 2018-01-30 NOTE — Telephone Encounter (Signed)
Referral placed.

## 2018-02-07 DIAGNOSIS — K580 Irritable bowel syndrome with diarrhea: Secondary | ICD-10-CM | POA: Diagnosis not present

## 2018-02-07 DIAGNOSIS — K219 Gastro-esophageal reflux disease without esophagitis: Secondary | ICD-10-CM | POA: Diagnosis not present

## 2018-02-08 DIAGNOSIS — L218 Other seborrheic dermatitis: Secondary | ICD-10-CM | POA: Diagnosis not present

## 2018-02-08 DIAGNOSIS — L718 Other rosacea: Secondary | ICD-10-CM | POA: Diagnosis not present

## 2018-02-08 DIAGNOSIS — B079 Viral wart, unspecified: Secondary | ICD-10-CM | POA: Diagnosis not present

## 2018-02-08 DIAGNOSIS — L820 Inflamed seborrheic keratosis: Secondary | ICD-10-CM | POA: Diagnosis not present

## 2018-02-14 DIAGNOSIS — H547 Unspecified visual loss: Secondary | ICD-10-CM | POA: Diagnosis not present

## 2018-02-14 DIAGNOSIS — T148XXS Other injury of unspecified body region, sequela: Secondary | ICD-10-CM | POA: Diagnosis not present

## 2018-02-14 DIAGNOSIS — R42 Dizziness and giddiness: Secondary | ICD-10-CM | POA: Diagnosis not present

## 2018-02-14 DIAGNOSIS — X58XXXS Exposure to other specified factors, sequela: Secondary | ICD-10-CM | POA: Diagnosis not present

## 2018-02-14 DIAGNOSIS — H8301 Labyrinthitis, right ear: Secondary | ICD-10-CM | POA: Diagnosis not present

## 2018-02-21 DIAGNOSIS — T148XXS Other injury of unspecified body region, sequela: Secondary | ICD-10-CM | POA: Diagnosis not present

## 2018-02-21 DIAGNOSIS — H547 Unspecified visual loss: Secondary | ICD-10-CM | POA: Diagnosis not present

## 2018-02-21 DIAGNOSIS — H8301 Labyrinthitis, right ear: Secondary | ICD-10-CM | POA: Diagnosis not present

## 2018-02-21 DIAGNOSIS — R42 Dizziness and giddiness: Secondary | ICD-10-CM | POA: Diagnosis not present

## 2018-02-21 DIAGNOSIS — X58XXXS Exposure to other specified factors, sequela: Secondary | ICD-10-CM | POA: Diagnosis not present

## 2018-02-28 DIAGNOSIS — H8301 Labyrinthitis, right ear: Secondary | ICD-10-CM | POA: Diagnosis not present

## 2018-02-28 DIAGNOSIS — H539 Unspecified visual disturbance: Secondary | ICD-10-CM | POA: Diagnosis not present

## 2018-02-28 DIAGNOSIS — R42 Dizziness and giddiness: Secondary | ICD-10-CM | POA: Diagnosis not present

## 2018-03-06 DIAGNOSIS — H8301 Labyrinthitis, right ear: Secondary | ICD-10-CM | POA: Diagnosis not present

## 2018-03-06 DIAGNOSIS — H539 Unspecified visual disturbance: Secondary | ICD-10-CM | POA: Diagnosis not present

## 2018-03-06 DIAGNOSIS — R42 Dizziness and giddiness: Secondary | ICD-10-CM | POA: Diagnosis not present

## 2018-03-13 DIAGNOSIS — H539 Unspecified visual disturbance: Secondary | ICD-10-CM | POA: Diagnosis not present

## 2018-03-13 DIAGNOSIS — R42 Dizziness and giddiness: Secondary | ICD-10-CM | POA: Diagnosis not present

## 2018-03-13 DIAGNOSIS — H8301 Labyrinthitis, right ear: Secondary | ICD-10-CM | POA: Diagnosis not present

## 2018-03-21 DIAGNOSIS — H539 Unspecified visual disturbance: Secondary | ICD-10-CM | POA: Diagnosis not present

## 2018-03-21 DIAGNOSIS — R42 Dizziness and giddiness: Secondary | ICD-10-CM | POA: Diagnosis not present

## 2018-03-21 DIAGNOSIS — H8301 Labyrinthitis, right ear: Secondary | ICD-10-CM | POA: Diagnosis not present

## 2018-03-28 DIAGNOSIS — J9801 Acute bronchospasm: Secondary | ICD-10-CM | POA: Diagnosis not present

## 2018-03-28 DIAGNOSIS — J101 Influenza due to other identified influenza virus with other respiratory manifestations: Secondary | ICD-10-CM | POA: Diagnosis not present

## 2018-05-04 ENCOUNTER — Encounter: Payer: Self-pay | Admitting: Family Medicine

## 2018-05-04 ENCOUNTER — Ambulatory Visit (INDEPENDENT_AMBULATORY_CARE_PROVIDER_SITE_OTHER): Payer: BLUE CROSS/BLUE SHIELD | Admitting: Family Medicine

## 2018-05-04 ENCOUNTER — Other Ambulatory Visit: Payer: Self-pay

## 2018-05-04 VITALS — BP 130/84 | HR 62 | Temp 99.0°F | Ht 67.0 in | Wt 230.0 lb

## 2018-05-04 DIAGNOSIS — Z0000 Encounter for general adult medical examination without abnormal findings: Secondary | ICD-10-CM

## 2018-05-04 DIAGNOSIS — K30 Functional dyspepsia: Secondary | ICD-10-CM | POA: Diagnosis not present

## 2018-05-04 DIAGNOSIS — F411 Generalized anxiety disorder: Secondary | ICD-10-CM

## 2018-05-04 DIAGNOSIS — K580 Irritable bowel syndrome with diarrhea: Secondary | ICD-10-CM

## 2018-05-04 DIAGNOSIS — E782 Mixed hyperlipidemia: Secondary | ICD-10-CM | POA: Diagnosis not present

## 2018-05-04 HISTORY — DX: Irritable bowel syndrome with diarrhea: K58.0

## 2018-05-04 LAB — COMPREHENSIVE METABOLIC PANEL
ALT: 67 U/L — ABNORMAL HIGH (ref 0–53)
AST: 45 U/L — ABNORMAL HIGH (ref 0–37)
Albumin: 4.7 g/dL (ref 3.5–5.2)
Alkaline Phosphatase: 48 U/L (ref 39–117)
BUN: 19 mg/dL (ref 6–23)
CO2: 29 mEq/L (ref 19–32)
Calcium: 9.5 mg/dL (ref 8.4–10.5)
Chloride: 99 mEq/L (ref 96–112)
Creatinine, Ser: 0.92 mg/dL (ref 0.40–1.50)
GFR: 95.29 mL/min (ref >60.00)
Glucose, Bld: 79 mg/dL (ref 70–99)
Potassium: 4.5 mEq/L (ref 3.5–5.1)
Sodium: 136 mEq/L (ref 135–145)
Total Bilirubin: 0.7 mg/dL (ref 0.2–1.2)
Total Protein: 7.1 g/dL (ref 6.0–8.3)

## 2018-05-04 LAB — LDL CHOLESTEROL, DIRECT: Direct LDL: 123 mg/dL

## 2018-05-04 LAB — LIPID PANEL
Cholesterol: 228 mg/dL — ABNORMAL HIGH (ref 0–200)
HDL: 40.9 mg/dL (ref >39.00)
NonHDL: 186.72
Total CHOL/HDL Ratio: 6
Triglycerides: 349 mg/dL — ABNORMAL HIGH (ref 0.0–149.0)
VLDL: 69.8 mg/dL — ABNORMAL HIGH (ref 0.0–40.0)

## 2018-05-04 LAB — CBC WITH DIFFERENTIAL/PLATELET
Basophils Absolute: 0 10*3/uL (ref 0.0–0.1)
Basophils Relative: 0.6 % (ref 0.0–3.0)
Eosinophils Absolute: 0.1 10*3/uL (ref 0.0–0.7)
Eosinophils Relative: 1.9 % (ref 0.0–5.0)
HCT: 42.7 % (ref 39.0–52.0)
Hemoglobin: 14.8 g/dL (ref 13.0–17.0)
Lymphocytes Relative: 26.2 % (ref 12.0–46.0)
Lymphs Abs: 1.3 10*3/uL (ref 0.7–4.0)
MCHC: 34.6 g/dL (ref 30.0–36.0)
MCV: 92.8 fl (ref 78.0–100.0)
Monocytes Absolute: 0.5 10*3/uL (ref 0.1–1.0)
Monocytes Relative: 9.6 % (ref 3.0–12.0)
Neutro Abs: 3 10*3/uL (ref 1.4–7.7)
Neutrophils Relative %: 61.7 % (ref 43.0–77.0)
Platelets: 249 10*3/uL (ref 150.0–400.0)
RBC: 4.6 Mil/uL (ref 4.22–5.81)
RDW: 12.6 % (ref 11.5–15.5)
WBC: 4.9 10*3/uL (ref 4.0–10.5)

## 2018-05-04 NOTE — Patient Instructions (Addendum)
It was so good seeing you again! Thank you for allowing me to continue caring for you. It means a lot to me.   Please go to the Lab for blood work.    If you have MyChart, your results will be available to view, please respond through [place] with questions.  We will schedule follow-up according to results.   Please schedule a follow up appointment with me in one year for your physical, please come fasting.    Please do these things to maintain good health!   Exercise at least 30-45 minutes a day,  4-5 days a week.   Eat a low-fat diet with lots of fruits and vegetables, up to 7-9 servings per day.  Drink plenty of water daily. Try to drink 8 8oz glasses per day.  Seatbelts can save your life. Always wear your seatbelt.  Place Smoke Detectors on every level of your home and check batteries every year.  Eye Doctor - have an eye exam every 1-2 years  Safe sex - use condoms to protect yourself from STDs if you could be exposed to these types of infections.  Avoid heavy alcohol use. If you drink, keep it to less than 2 drinks/day and not every day.  [place].  Choose someone you trust that could speak for you if you became unable to speak for yourself.  Depression is common in our stressful world.If you're feeling down or losing interest in things you normally enjoy, please come in for a visit.

## 2018-05-04 NOTE — Progress Notes (Signed)
Subjective  Chief Complaint  Patient presents with  . Annual Exam    doing well. HM up to date.   Marland Kitchen Heartburn    patient on nexium, feels heartburn from time to time.    HPI: Shannon Ibarra is a 42 y.o. male who presents to [place] [facility] today for a Male Wellness Visit.   Wellness Visit: annual visit with health maintenance review and exam    Doing well. Reviewed recent GI notes. On nexium and levbid. Stress in life cause most of his sxs. Restarted smoking at work 2-3 cig/day for stress relief and loves it. Won't quit.   Mood is stable on meds. No long on T-replacement so less aggressive.   No new concerns. Reviewed problems.  Lifestyle: Body mass index is 36.02 kg/m. Wt Readings from Last 3 Encounters:  05/04/18 230 lb (104.3 kg)  01/10/18 236 lb 4 oz (107.2 kg)   Diet: general Exercise: frequently, weightlifting  Patient Active Problem List   Diagnosis Date Noted  . GAD (generalized anxiety disorder) 01/10/2018    Priority: High  . Vestibular nerve damage 01/10/2018    Priority: High  . Obstructive sleep apnea syndrome, severe 10/19/2016    Priority: High  . Mixed hyperlipidemia 05/10/2008    Priority: High  . Irritable bowel syndrome with diarrhea 05/04/2018    Priority: Medium  . History of fatty infiltration of liver 01/10/2018    Priority: Medium  . Nonulcer dyspepsia 01/10/2018    Priority: Medium  . Class 2 obesity due to excess calories without serious comorbidity with body mass index (BMI) of 35.0 to 35.9 in adult 10/21/2014    Priority: Medium  . Hypogonadotropic hypogonadism in male [facility]) 02/19/2014    Priority: Medium  . Acne rosacea 06/07/2014    Priority: Low  . Drug-induced gynecomastia 02/19/2014    Priority: Low  . Allergic rhinitis 05/10/2008    Priority: Low  . Non-cardiac chest pain 01/10/2018   Health Maintenance  Topic Date Due  . HIV Screening  01/08/1990  . INFLUENZA VACCINE  07/27/2018  .  TETANUS/TDAP  12/06/2023   Immunization History  Administered Date(s) Administered  . Influenza Split 10/17/2015  . Influenza,inj,Quad PF,6+ Mos 11/03/2017  . Td 12/27/2002  . Tdap 12/05/2013   We updated and reviewed the patient's past history in detail and it is documented below. Allergies: Patient is allergic to nitroglycerin; bupropion; and dust mite extract. Past Medical History  has a past medical history of Acne, Allergic rhinitis, Anxiety, Fatty liver, Hyperlipidemia (11/2282013), Irritable bowel syndrome with diarrhea (05/04/2018), Nonulcer dyspepsia (01/10/2018), Rosacea, acne (06/07/2014), Vestibular nerve damage (01/10/2018), and Vestibular neuritis (10/13/2017). Past Surgical History  has a past surgical history that includes Tonsillectomy. Social History Patient  reports that he quit smoking about 11 years ago. His smoking use included cigarettes. He has a 12.00 pack-year smoking history. He has never used smokeless tobacco. He reports that he drinks about 1.2 oz of alcohol per week. He reports that he does not use drugs. Family History Patient family history includes Colon polyps in his father; Hypertension in his brother and mother; Throat cancer in his father. Review of Systems: Constitutional: negative for fever or malaise Ophthalmic: negative for photophobia, double vision or loss of vision Cardiovascular: negative for chest pain, dyspnea on exertion, or new LE swelling Respiratory: negative for SOB or persistent cough Gastrointestinal: negative for abdominal pain, change in bowel habits or melena Genitourinary: negative for dysuria or gross hematuria Musculoskeletal: negative  for new gait disturbance or muscular weakness Integumentary: negative for new or persistent rashes, no breast lumps Neurological: negative for TIA or stroke symptoms Psychiatric: negative for SI or delusions Allergic/Immunologic: negative for hives  Patient Care Team    Relationship Specialty  Notifications Start End  Leamon Arnt, MD PCP - General Family Medicine  01/10/18   Janie Morning, MD  Gastroenterology  01/10/18   Susette Racer, MD Consulting Physician Endocrinology  01/10/18   Dionne Milo, MD Consulting Physician Pulmonary Disease  01/10/18    Objective  Vitals: BP 130/84   Pulse 62   Temp 99 F (37.2 C)   Ht 5' 7"  (1.702 m)   Wt 230 lb (104.3 kg)   BMI 36.02 kg/m  General:  Well developed, well nourished, no acute distress  Psych:  Alert and orientedx3,normal mood and affect HEENT:  Normocephalic, atraumatic, non-icteric sclera, PERRL, oropharynx is clear without mass or exudate, supple neck without adenopathy, mass or thyromegaly Cardiovascular:  Normal S1, S2, RRR without gallop, rub or murmur, nondisplaced PMI, +2 distal pulses in bilateral upper and lower extremities. Respiratory:  Good breath sounds bilaterally, CTAB with normal respiratory effort Gastrointestinal: normal bowel sounds, soft, non-tender, no noted masses. No HSM MSK: no deformities, contusions. Joints are without erythema or swelling. Spine and CVA region are nontender Skin:  Warm, no rashes or suspicious lesions noted Neurologic:    Mental status is normal. CN 2-11 are normal. Gross motor and sensory exams are normal. Stable gait. No tremor GU: No inguinal hernias or adenopathy are appreciated bilaterally  Assessment  1. Annual physical exam   2. Mixed hyperlipidemia   3. GAD (generalized anxiety disorder)   4. Nonulcer dyspepsia      Plan  Male Wellness Visit:  Age appropriate Health Maintenance and Prevention measures were discussed with patient. Included topics are cancer screening recommendations, ways to keep healthy (see AVS) including dietary and exercise recommendations, regular eye and dental care, use of seat belts, and avoidance of moderate alcohol use and tobacco use.   BMI: discussed patient's BMI and encouraged positive lifestyle modifications to help get to or  maintain a target BMI.  HM needs and immunizations were addressed and ordered. See below for orders. See HM and immunization section for updates.  Routine labs and screening tests ordered including cmp, cbc and lipids where appropriate.  Discussed recommendations regarding Vit D and calcium supplementation (see AVS)  Most chronic problems remain well controlled. Seeing gi for ibs and gerd. Continue current meds. No changes today.   Follow up: Return in about 1 year (around 05/05/2019) for complete physical.   Commons side effects, risks, benefits, and alternatives for medications and treatment plan prescribed today were discussed, and the patient expressed understanding of the given instructions. Patient is instructed to call or message via MyChart if he/she has any questions or concerns regarding our treatment plan. No barriers to understanding were identified. We discussed Red Flag symptoms and signs in detail. Patient expressed understanding regarding what to do in case of urgent or emergency type symptoms.   Medication list was reconciled, printed and provided to the patient in AVS. Patient instructions and summary information was reviewed with the patient as documented in the AVS. This note was prepared with assistance of Dragon voice recognition software. Occasional wrong-word or sound-a-like substitutions may have occurred due to the inherent limitations of voice recognition software  Orders Placed This Encounter  Procedures  . CBC with Differential/Platelet  . Comprehensive metabolic panel  .  Lipid panel  . HIV antibody   No orders of the defined types were placed in this encounter.

## 2018-05-05 ENCOUNTER — Other Ambulatory Visit: Payer: Self-pay | Admitting: Emergency Medicine

## 2018-05-05 ENCOUNTER — Telehealth: Payer: Self-pay | Admitting: Family Medicine

## 2018-05-05 DIAGNOSIS — R945 Abnormal results of liver function studies: Secondary | ICD-10-CM

## 2018-05-05 DIAGNOSIS — R7989 Other specified abnormal findings of blood chemistry: Secondary | ICD-10-CM

## 2018-05-05 DIAGNOSIS — E782 Mixed hyperlipidemia: Secondary | ICD-10-CM

## 2018-05-05 LAB — HIV ANTIBODY (ROUTINE TESTING W REFLEX): HIV 1&2 Ab, 4th Generation: NONREACTIVE

## 2018-05-05 MED ORDER — FISH OIL 1000 MG PO CAPS: 1000.0000 mg | ORAL_CAPSULE | Freq: Two times a day (BID) | ORAL | 0 refills | Status: DC

## 2018-05-05 MED ORDER — SIMVASTATIN 10 MG PO TABS: 10.0000 mg | ORAL_TABLET | Freq: Every day | ORAL | 3 refills | Status: DC

## 2018-05-05 NOTE — Telephone Encounter (Signed)
Ordered simvastatin 10 and rec fish oil. 07/20/2018

## 2018-05-05 NOTE — Telephone Encounter (Signed)
-----   Message from Sigurd Sos, LPN sent at 5/40/9811 11:24 AM EDT ----- Spoke with Patient regarding lab results, discussed Provider recommendations. Advised to call back with questions or concerns. Lab Appt 07/20/2018 & orders placed.   Patient states he was fasting and that he will be willing to start medication.                                                                Patient verbalized understanding  Doloris Hall, LPN

## 2018-05-05 NOTE — Telephone Encounter (Signed)
-----   Message from Sigurd Sos, LPN sent at 5/57/3220 11:24 AM EDT ----- Spoke with Patient regarding lab results, discussed Provider recommendations. Advised to call back with questions or concerns. Lab Appt 07/20/2018 & orders placed.   Patient states he was fasting and that he will be willing to start medication.                                                                Patient verbalized understanding  Doloris Hall, LPN

## 2018-06-21 ENCOUNTER — Other Ambulatory Visit: Payer: Self-pay | Admitting: Family Medicine

## 2018-06-21 NOTE — Telephone Encounter (Signed)
Copied from [place] [id number]. Topic: Quick Communication - See Telephone Encounter >> Jun 21, 2018  4:57 PM Vernona Rieger wrote: CRM for notification. See Telephone encounter for: 06/21/18.   Patient said that he started back smoking and now he wants to quit. He said that Dr Jonni Sanger gave him " NICOTROL " years ago and he would like that to be prescribed to him again. He needs that to be called into [place], [place]

## 2018-06-22 MED ORDER — NICOTINE 10 MG IN INHA: 1.0000 | RESPIRATORY_TRACT | 2 refills | Status: DC | PRN

## 2018-06-27 MED ORDER — NICOTINE 10 MG IN INHA: 1.0000 | RESPIRATORY_TRACT | 2 refills | Status: DC | PRN

## 2018-06-27 NOTE — Telephone Encounter (Signed)
[facility]   Doloris Hall,  LPN

## 2018-06-27 NOTE — Telephone Encounter (Signed)
Patient called and states that he can't not get his nicotine (NICOTROL) 10 MG inhaler   RX through [facility]. They are saying they need a Provider Review . [facility] states they have reached out to the office with no response back. Please call [facility] to get this process going. Thank you

## 2018-06-27 NOTE — Addendum Note (Signed)
Addended bySigurd Sos on: 06/27/2018 02:00 PM   Modules accepted: Orders

## 2018-07-20 ENCOUNTER — Other Ambulatory Visit (INDEPENDENT_AMBULATORY_CARE_PROVIDER_SITE_OTHER): Payer: BLUE CROSS/BLUE SHIELD

## 2018-07-20 DIAGNOSIS — E782 Mixed hyperlipidemia: Secondary | ICD-10-CM | POA: Diagnosis not present

## 2018-07-20 DIAGNOSIS — R945 Abnormal results of liver function studies: Secondary | ICD-10-CM

## 2018-07-20 DIAGNOSIS — R7989 Other specified abnormal findings of blood chemistry: Secondary | ICD-10-CM

## 2018-07-20 LAB — HEPATIC FUNCTION PANEL
ALT: 69 U/L — ABNORMAL HIGH (ref 0–53)
AST: 40 U/L — ABNORMAL HIGH (ref 0–37)
Albumin: 4.7 g/dL (ref 3.5–5.2)
Alkaline Phosphatase: 48 U/L (ref 39–117)
Bilirubin, Direct: 0.1 mg/dL (ref 0.0–0.3)
Total Bilirubin: 0.6 mg/dL (ref 0.2–1.2)
Total Protein: 7.2 g/dL (ref 6.0–8.3)

## 2018-07-20 LAB — LIPID PANEL
Cholesterol: 176 mg/dL (ref 0–200)
HDL: 43.9 mg/dL (ref >39.00)
NonHDL: 132.28
Total CHOL/HDL Ratio: 4
Triglycerides: 229 mg/dL — ABNORMAL HIGH (ref 0.0–149.0)
VLDL: 45.8 mg/dL — ABNORMAL HIGH (ref 0.0–40.0)

## 2018-07-20 LAB — LDL CHOLESTEROL, DIRECT: Direct LDL: 102 mg/dL

## 2018-09-01 DIAGNOSIS — Z6837 Body mass index (BMI) 37.0-37.9, adult: Secondary | ICD-10-CM | POA: Diagnosis not present

## 2018-09-01 DIAGNOSIS — G4733 Obstructive sleep apnea (adult) (pediatric): Secondary | ICD-10-CM | POA: Diagnosis not present

## 2018-09-01 DIAGNOSIS — F17200 Nicotine dependence, unspecified, uncomplicated: Secondary | ICD-10-CM | POA: Diagnosis not present

## 2018-09-01 DIAGNOSIS — Z23 Encounter for immunization: Secondary | ICD-10-CM | POA: Diagnosis not present

## 2018-09-01 DIAGNOSIS — J3089 Other allergic rhinitis: Secondary | ICD-10-CM | POA: Diagnosis not present

## 2018-09-04 DIAGNOSIS — G4710 Hypersomnia, unspecified: Secondary | ICD-10-CM | POA: Diagnosis not present

## 2018-09-10 DIAGNOSIS — G4733 Obstructive sleep apnea (adult) (pediatric): Secondary | ICD-10-CM | POA: Diagnosis not present

## 2018-09-11 DIAGNOSIS — G4733 Obstructive sleep apnea (adult) (pediatric): Secondary | ICD-10-CM | POA: Diagnosis not present

## 2018-09-11 DIAGNOSIS — F17200 Nicotine dependence, unspecified, uncomplicated: Secondary | ICD-10-CM | POA: Diagnosis not present

## 2018-09-11 DIAGNOSIS — Z6837 Body mass index (BMI) 37.0-37.9, adult: Secondary | ICD-10-CM | POA: Diagnosis not present

## 2018-09-11 DIAGNOSIS — J3089 Other allergic rhinitis: Secondary | ICD-10-CM | POA: Diagnosis not present

## 2018-09-30 ENCOUNTER — Encounter: Payer: Self-pay | Admitting: Family Medicine

## 2018-10-02 MED ORDER — VALACYCLOVIR HCL 1 G PO TABS: 2000.0000 mg | ORAL_TABLET | Freq: Two times a day (BID) | ORAL | 2 refills | Status: DC

## 2018-10-10 ENCOUNTER — Encounter: Payer: Self-pay | Admitting: Family Medicine

## 2018-10-10 MED ORDER — CLONAZEPAM 0.5 MG PO TABS: 0.5000 mg | ORAL_TABLET | Freq: Two times a day (BID) | ORAL | 3 refills | Status: DC | PRN

## 2018-10-18 DIAGNOSIS — G4733 Obstructive sleep apnea (adult) (pediatric): Secondary | ICD-10-CM | POA: Diagnosis not present

## 2018-11-18 DIAGNOSIS — G4733 Obstructive sleep apnea (adult) (pediatric): Secondary | ICD-10-CM | POA: Diagnosis not present

## 2018-11-27 DIAGNOSIS — G4733 Obstructive sleep apnea (adult) (pediatric): Secondary | ICD-10-CM | POA: Diagnosis not present

## 2018-11-28 DIAGNOSIS — F17200 Nicotine dependence, unspecified, uncomplicated: Secondary | ICD-10-CM | POA: Diagnosis not present

## 2018-11-28 DIAGNOSIS — G4733 Obstructive sleep apnea (adult) (pediatric): Secondary | ICD-10-CM | POA: Diagnosis not present

## 2018-11-28 DIAGNOSIS — G4719 Other hypersomnia: Secondary | ICD-10-CM | POA: Diagnosis not present

## 2018-12-14 DIAGNOSIS — G4733 Obstructive sleep apnea (adult) (pediatric): Secondary | ICD-10-CM | POA: Diagnosis not present

## 2018-12-18 DIAGNOSIS — G4733 Obstructive sleep apnea (adult) (pediatric): Secondary | ICD-10-CM | POA: Diagnosis not present

## 2019-01-05 ENCOUNTER — Other Ambulatory Visit: Payer: Self-pay | Admitting: Family Medicine

## 2019-01-18 DIAGNOSIS — G4733 Obstructive sleep apnea (adult) (pediatric): Secondary | ICD-10-CM | POA: Diagnosis not present

## 2019-02-18 DIAGNOSIS — G4733 Obstructive sleep apnea (adult) (pediatric): Secondary | ICD-10-CM | POA: Diagnosis not present

## 2019-02-27 LAB — BASIC METABOLIC PANEL
BUN: 24 — AB (ref 4–21)
Creatinine: 1 (ref 0.6–1.3)
Glucose: 89

## 2019-02-27 LAB — LIPID PANEL
Cholesterol: 196 (ref 0–200)
HDL: 41 (ref 35–70)
LDL Cholesterol: 75
LDl/HDL Ratio: 1.8
Triglycerides: 397 — AB (ref 40–160)

## 2019-03-01 ENCOUNTER — Encounter: Payer: Self-pay | Admitting: Family Medicine

## 2019-03-05 ENCOUNTER — Telehealth: Payer: Self-pay | Admitting: Family Medicine

## 2019-03-05 NOTE — Telephone Encounter (Signed)
Pt dropped off forms to be completed by Dr. Jonni Sanger, placed in bin upfront with charge sheet. Pt asked that forms be mailed when completed.

## 2019-03-07 ENCOUNTER — Encounter: Payer: Self-pay | Admitting: *Deleted

## 2019-03-07 DIAGNOSIS — Z0279 Encounter for issue of other medical certificate: Secondary | ICD-10-CM

## 2019-03-07 NOTE — Telephone Encounter (Signed)
Completed, copied, and mailed

## 2019-03-19 DIAGNOSIS — G4733 Obstructive sleep apnea (adult) (pediatric): Secondary | ICD-10-CM | POA: Diagnosis not present

## 2019-04-02 ENCOUNTER — Encounter (INDEPENDENT_AMBULATORY_CARE_PROVIDER_SITE_OTHER): Payer: BLUE CROSS/BLUE SHIELD | Admitting: Family Medicine

## 2019-04-02 DIAGNOSIS — H1013 Acute atopic conjunctivitis, bilateral: Secondary | ICD-10-CM

## 2019-04-02 MED ORDER — OLOPATADINE HCL 0.1 % OP SOLN: 1.0000 [drp] | Freq: Two times a day (BID) | OPHTHALMIC | 1 refills | Status: DC

## 2019-04-02 NOTE — Addendum Note (Signed)
Addended by: Billey Chang on: 04/02/2019 09:40 AM   Modules accepted: Orders

## 2019-04-02 NOTE — Telephone Encounter (Signed)
A total of 7 minutes were spent by me to personally review the patient-generated inquiry, review patient records and data pertinent to assessment of the patient's problem, develop a management plan including generation of prescriptions and/or orders, and on subsequent communication with the patient through secure the MyChart portal service. There is no separately reported E/M service related to this service in the past 7 days nor does the patient have an upcoming soonest available appointment for this issue. This work was completed in less than 7 days.

## 2019-04-19 DIAGNOSIS — G4733 Obstructive sleep apnea (adult) (pediatric): Secondary | ICD-10-CM | POA: Diagnosis not present

## 2019-04-30 ENCOUNTER — Encounter: Payer: Self-pay | Admitting: Family Medicine

## 2019-05-05 ENCOUNTER — Other Ambulatory Visit: Payer: Self-pay | Admitting: Family Medicine

## 2019-05-08 ENCOUNTER — Encounter: Payer: BLUE CROSS/BLUE SHIELD | Admitting: Family Medicine

## 2019-05-19 DIAGNOSIS — G4733 Obstructive sleep apnea (adult) (pediatric): Secondary | ICD-10-CM | POA: Diagnosis not present

## 2019-05-29 DIAGNOSIS — G4733 Obstructive sleep apnea (adult) (pediatric): Secondary | ICD-10-CM | POA: Diagnosis not present

## 2019-05-30 DIAGNOSIS — G4733 Obstructive sleep apnea (adult) (pediatric): Secondary | ICD-10-CM | POA: Diagnosis not present

## 2019-05-30 DIAGNOSIS — G4719 Other hypersomnia: Secondary | ICD-10-CM | POA: Diagnosis not present

## 2019-06-06 ENCOUNTER — Other Ambulatory Visit: Payer: Self-pay | Admitting: Family Medicine

## 2019-06-09 ENCOUNTER — Emergency Department
Admission: EM | Admit: 2019-06-09 | Discharge: 2019-06-09 | Discharge status: Absent without leave | Payer: BLUE CROSS/BLUE SHIELD | Source: Home / Self Care

## 2019-06-19 DIAGNOSIS — G4733 Obstructive sleep apnea (adult) (pediatric): Secondary | ICD-10-CM | POA: Diagnosis not present

## 2019-06-21 ENCOUNTER — Encounter: Payer: Self-pay | Admitting: Family Medicine

## 2019-06-21 ENCOUNTER — Ambulatory Visit (INDEPENDENT_AMBULATORY_CARE_PROVIDER_SITE_OTHER): Payer: BC Managed Care – PPO | Admitting: Family Medicine

## 2019-06-21 DIAGNOSIS — E230 Hypopituitarism: Secondary | ICD-10-CM | POA: Diagnosis not present

## 2019-06-21 DIAGNOSIS — F411 Generalized anxiety disorder: Secondary | ICD-10-CM | POA: Diagnosis not present

## 2019-06-21 DIAGNOSIS — G4733 Obstructive sleep apnea (adult) (pediatric): Secondary | ICD-10-CM | POA: Diagnosis not present

## 2019-06-21 DIAGNOSIS — K30 Functional dyspepsia: Secondary | ICD-10-CM

## 2019-06-21 DIAGNOSIS — K580 Irritable bowel syndrome with diarrhea: Secondary | ICD-10-CM

## 2019-06-21 DIAGNOSIS — E782 Mixed hyperlipidemia: Secondary | ICD-10-CM | POA: Diagnosis not present

## 2019-06-21 DIAGNOSIS — Z8719 Personal history of other diseases of the digestive system: Secondary | ICD-10-CM

## 2019-06-21 MED ORDER — PAROXETINE HCL 20 MG PO TABS: 20.0000 mg | ORAL_TABLET | Freq: Every morning | ORAL | 3 refills | Status: DC

## 2019-06-21 NOTE — Progress Notes (Signed)
Virtual Visit via Video Note  Subjective  CC:  Chief Complaint  Patient presents with  . Anxiety  . Hyperlipidemia  . Gastroesophageal Reflux     I connected with [place] on 06/21/19 at 10:00 AM EDT by a video enabled telemedicine application and verified that I am speaking with the correct person using two identifiers. Location patient: Home Location provider: [facility] [place], Office Persons participating in the virtual visit: [employer], Leamon Arnt, MD Lilli Light, [place] discussed the limitations of evaluation and management by telemedicine and the availability of in person appointments. The patient expressed understanding and agreed to proceed. HPI: Shannon Ibarra is a 43 y.o. male who was contacted today to address the problems listed above in the chief complaint. . Chronic medical problem follow-up: Patient is doing remarkably well.  His wife is being treated for breast cancer and both are coping well.  His new job is going very well with much less stress.  Reports that he has found religion and that has helped him cope with life stressors in miraculous ways.  Overall he has no problems with his anxiety.  No recent panic attacks.  Using his medications regularly without concerns.  Diet and exercise remain consistent with regular exercise and fair diet.  No longer taking testosterone treatments.  No current symptoms of IBS abdominal pain.  No GERD symptoms.  He will return for fasting lab work. Assessment  1. GAD (generalized anxiety disorder)   2. Mixed hyperlipidemia   3. Obstructive sleep apnea syndrome, severe   4. Hypogonadotropic hypogonadism in male [facility])   5. Irritable bowel syndrome with diarrhea   6. History of fatty infiltration of liver   7. Nonulcer dyspepsia      Plan   Chronic medical problems are well controlled.  See list from above.  Will check lab work when he comes in.  Continue current medications at current  dosing.  Recheck 1 year I discussed the assessment and treatment plan with the patient. The patient was provided an opportunity to ask questions and all were answered. The patient agreed with the plan and demonstrated an understanding of the instructions.   The patient was advised to call back or seek an in-person evaluation if the symptoms worsen or if the condition fails to improve as anticipated. Follow up: 12 months for complete physical Visit date not found  Meds ordered this encounter  Medications  . PARoxetine (PAXIL) 20 MG tablet    Sig: Take 1 tablet (20 mg total) by mouth every morning.    Dispense:  90 tablet    Refill:  3      I reviewed the patients updated PMH, FH, and SocHx.    Patient Active Problem List   Diagnosis Date Noted  . GAD (generalized anxiety disorder) 01/10/2018    Priority: High  . Vestibular nerve damage 01/10/2018    Priority: High  . Obstructive sleep apnea syndrome, severe 10/19/2016    Priority: High  . Mixed hyperlipidemia 05/10/2008    Priority: High  . Irritable bowel syndrome with diarrhea 05/04/2018    Priority: Medium  . History of fatty infiltration of liver 01/10/2018    Priority: Medium  . Nonulcer dyspepsia 01/10/2018    Priority: Medium  . Class 2 obesity due to excess calories without serious comorbidity with body mass index (BMI) of 35.0 to 35.9 in adult 10/21/2014    Priority: Medium  .  Hypogonadotropic hypogonadism in male [facility]) 02/19/2014    Priority: Medium  . Acne rosacea 06/07/2014    Priority: Low  . Drug-induced gynecomastia 02/19/2014    Priority: Low  . Allergic rhinitis 05/10/2008    Priority: Low  . Non-cardiac chest pain 01/10/2018   Current Meds  Medication Sig  . clonazePAM (KLONOPIN) 0.5 MG tablet TAKE 1 TABLET BY MOUTH TWICE DAILY AS NEEDED FOR ANXIETY  . esomeprazole (NEXIUM) 40 MG capsule Take 20 mg by mouth daily.  Marland Kitchen ketoconazole (NIZORAL) 2 % shampoo Apply topically 2 (two) times a week. As needed   . minocycline (MINOCIN,DYNACIN) 100 MG capsule TAKE 1 CAPSULE TWICE A DAY PRN  . olopatadine (PATANOL) 0.1 % ophthalmic solution Place 1 drop into both eyes 2 (two) times daily.  . Omega-3 Fatty Acids (FISH OIL) 1000 MG CAPS Take 1 capsule (1,000 mg total) by mouth 2 (two) times daily.  Marland Kitchen PARoxetine (PAXIL) 20 MG tablet Take 1 tablet (20 mg total) by mouth every morning.  . simvastatin (ZOCOR) 10 MG tablet TAKE 1 TABLET BY MOUTH AT BEDTIME  . valACYclovir (VALTREX) 1000 MG tablet Take 2 tablets (2,000 mg total) by mouth 2 (two) times daily. Once, as needed for cold sores  . [DISCONTINUED] nicotine (NICOTROL) 10 MG inhaler Inhale 1 Cartridge (1 continuous puffing total) into the lungs as needed for smoking cessation.  . [DISCONTINUED] PARoxetine (PAXIL) 20 MG tablet TAKE 1 TABLET EVERY MORNING    Allergies: Patient is allergic to nitroglycerin; bupropion; and dust mite extract. Family History: Patient family history includes Colon polyps in his father; Hypertension in his brother and mother; Throat cancer in his father. Social History:  Patient  reports that he has been smoking cigarettes. He has a 12.00 pack-year smoking history. He has never used smokeless tobacco. He reports current alcohol use of about 2.0 standard drinks of alcohol per week. He reports that he does not use drugs.  Review of Systems: Constitutional: Negative for fever malaise or anorexia Cardiovascular: negative for chest pain Respiratory: negative for SOB or persistent cough Gastrointestinal: negative for abdominal pain  OBJECTIVE Vitals: There were no vitals taken for this visit. General: no acute distress , A&Ox3  Leamon Arnt, MD

## 2019-07-19 ENCOUNTER — Encounter: Payer: BLUE CROSS/BLUE SHIELD | Admitting: Family Medicine

## 2019-07-19 DIAGNOSIS — G4733 Obstructive sleep apnea (adult) (pediatric): Secondary | ICD-10-CM | POA: Diagnosis not present

## 2019-10-10 DIAGNOSIS — G4733 Obstructive sleep apnea (adult) (pediatric): Secondary | ICD-10-CM | POA: Diagnosis not present

## 2019-11-13 DIAGNOSIS — G4733 Obstructive sleep apnea (adult) (pediatric): Secondary | ICD-10-CM | POA: Diagnosis not present

## 2019-11-14 ENCOUNTER — Encounter: Payer: Self-pay | Admitting: Family Medicine

## 2019-11-15 ENCOUNTER — Other Ambulatory Visit: Payer: Self-pay

## 2019-11-15 NOTE — Progress Notes (Unsigned)
Patient informed via MyChart message that Dr. Jonni Sanger is gone for the day, will send in on 11/20.

## 2019-11-16 MED ORDER — CLONAZEPAM 0.5 MG PO TABS: 0.5000 mg | ORAL_TABLET | Freq: Two times a day (BID) | ORAL | 3 refills | Status: DC | PRN

## 2019-11-16 NOTE — Progress Notes (Unsigned)
Please call pt: I have refilled his clonazepam; He did not come in for his lab work back in June? Please schedule him for a fasting lab visit to get this done.   Next cpe due May 2021. Thanks.

## 2019-11-16 NOTE — Progress Notes (Signed)
Left vm message for patient requesting a call back.

## 2019-11-19 ENCOUNTER — Other Ambulatory Visit: Payer: Self-pay | Admitting: Family Medicine

## 2019-11-19 ENCOUNTER — Telehealth: Payer: Self-pay

## 2019-11-19 ENCOUNTER — Other Ambulatory Visit: Payer: Self-pay

## 2019-11-19 NOTE — Telephone Encounter (Signed)
error 

## 2019-11-19 NOTE — Telephone Encounter (Signed)
Responded to patient via MyChart message.

## 2019-11-19 NOTE — Telephone Encounter (Signed)
We currently are not recommending antibody testing on asymptomatic patients. This is in part due to the test not being great, meaning there are false negatives, it shouldn't change behavior at this time, and we are not certain about how [place] last. However if he wants the test he can have it ordered and done in our office. He would be responsible for the cost or finding out if his insurance covers it.

## 2019-11-19 NOTE — Progress Notes (Signed)
Spoke to patient and advised that clonazepam rx has been refilled and scheduled fasting lab appt for 11/24.  Patient verbalized understanding.

## 2019-11-20 ENCOUNTER — Other Ambulatory Visit (INDEPENDENT_AMBULATORY_CARE_PROVIDER_SITE_OTHER): Payer: BC Managed Care – PPO

## 2019-11-20 DIAGNOSIS — K30 Functional dyspepsia: Secondary | ICD-10-CM

## 2019-11-20 DIAGNOSIS — Z8719 Personal history of other diseases of the digestive system: Secondary | ICD-10-CM

## 2019-11-20 DIAGNOSIS — F411 Generalized anxiety disorder: Secondary | ICD-10-CM

## 2019-11-20 DIAGNOSIS — E782 Mixed hyperlipidemia: Secondary | ICD-10-CM | POA: Diagnosis not present

## 2019-11-20 DIAGNOSIS — G4733 Obstructive sleep apnea (adult) (pediatric): Secondary | ICD-10-CM

## 2019-11-20 DIAGNOSIS — K580 Irritable bowel syndrome with diarrhea: Secondary | ICD-10-CM

## 2019-11-20 LAB — CBC WITH DIFFERENTIAL/PLATELET
Basophils Absolute: 0 10*3/uL (ref 0.0–0.1)
Basophils Relative: 0.8 % (ref 0.0–3.0)
Eosinophils Absolute: 0.1 10*3/uL (ref 0.0–0.7)
Eosinophils Relative: 1.8 % (ref 0.0–5.0)
HCT: 43.4 % (ref 39.0–52.0)
Hemoglobin: 14.9 g/dL (ref 13.0–17.0)
Lymphocytes Relative: 26 % (ref 12.0–46.0)
Lymphs Abs: 1.6 10*3/uL (ref 0.7–4.0)
MCHC: 34.3 g/dL (ref 30.0–36.0)
MCV: 92.3 fl (ref 78.0–100.0)
Monocytes Absolute: 0.6 10*3/uL (ref 0.1–1.0)
Monocytes Relative: 10.1 % (ref 3.0–12.0)
Neutro Abs: 3.7 10*3/uL (ref 1.4–7.7)
Neutrophils Relative %: 61.3 % (ref 43.0–77.0)
Platelets: 244 10*3/uL (ref 150.0–400.0)
RBC: 4.71 Mil/uL (ref 4.22–5.81)
RDW: 12.6 % (ref 11.5–15.5)
WBC: 6 10*3/uL (ref 4.0–10.5)

## 2019-11-20 LAB — LDL CHOLESTEROL, DIRECT: Direct LDL: 88 mg/dL

## 2019-11-20 LAB — COMPREHENSIVE METABOLIC PANEL
ALT: 69 U/L — ABNORMAL HIGH (ref 0–53)
AST: 46 U/L — ABNORMAL HIGH (ref 0–37)
Albumin: 4.3 g/dL (ref 3.5–5.2)
Alkaline Phosphatase: 54 U/L (ref 39–117)
BUN: 21 mg/dL (ref 6–23)
CO2: 29 mEq/L (ref 19–32)
Calcium: 9.5 mg/dL (ref 8.4–10.5)
Chloride: 99 mEq/L (ref 96–112)
Creatinine, Ser: 0.89 mg/dL (ref 0.40–1.50)
GFR: 92.49 mL/min (ref >60.00)
Glucose, Bld: 94 mg/dL (ref 70–99)
Potassium: 4.7 mEq/L (ref 3.5–5.1)
Sodium: 134 mEq/L — ABNORMAL LOW (ref 135–145)
Total Bilirubin: 0.7 mg/dL (ref 0.2–1.2)
Total Protein: 7 g/dL (ref 6.0–8.3)

## 2019-11-20 LAB — LIPID PANEL
Cholesterol: 161 mg/dL (ref 0–200)
HDL: 36.3 mg/dL — ABNORMAL LOW (ref >39.00)
NonHDL: 124.38
Total CHOL/HDL Ratio: 4
Triglycerides: 219 mg/dL — ABNORMAL HIGH (ref 0.0–149.0)
VLDL: 43.8 mg/dL — ABNORMAL HIGH (ref 0.0–40.0)

## 2019-11-20 LAB — TSH: TSH: 1.07 u[IU]/mL (ref 0.35–4.50)

## 2019-12-03 DIAGNOSIS — Z20828 Contact with and (suspected) exposure to other viral communicable diseases: Secondary | ICD-10-CM | POA: Diagnosis not present

## 2020-02-01 ENCOUNTER — Ambulatory Visit: Payer: BC Managed Care – PPO | Admitting: Family Medicine

## 2020-02-12 DIAGNOSIS — G4733 Obstructive sleep apnea (adult) (pediatric): Secondary | ICD-10-CM | POA: Diagnosis not present

## 2020-05-14 ENCOUNTER — Other Ambulatory Visit: Payer: Self-pay

## 2020-05-14 ENCOUNTER — Telehealth (INDEPENDENT_AMBULATORY_CARE_PROVIDER_SITE_OTHER): Payer: BC Managed Care – PPO | Admitting: Physician Assistant

## 2020-05-14 ENCOUNTER — Encounter: Payer: Self-pay | Admitting: Physician Assistant

## 2020-05-14 VITALS — Temp 98.5°F

## 2020-05-14 DIAGNOSIS — J029 Acute pharyngitis, unspecified: Secondary | ICD-10-CM | POA: Diagnosis not present

## 2020-05-14 MED ORDER — AMOXICILLIN 875 MG PO TABS: 875.0000 mg | ORAL_TABLET | Freq: Two times a day (BID) | ORAL | 0 refills | Status: DC

## 2020-05-14 NOTE — Progress Notes (Signed)
Virtual Visit via Video   I connected with Shannon Ibarra on 05/14/20 at 12:00 PM EDT by a video enabled telemedicine application and verified that I am speaking with the correct person using two identifiers. Location patient: Home Location provider: [facility], Office Persons participating in the virtual visit: Jonathon J Neidig, Simora Dingee PA-C, Anselmo Pickler, LPN   I discussed the limitations of evaluation and management by telemedicine and the availability of in person appointments. The patient expressed understanding and agreed to proceed.  I acted as a Education administrator for [employer], PA-C [employer], LPN   Subjective:   HPI:   Sinus problem Pt c/o sinus pressure, dull headache and sore throat, coughing and expectorating yellow sputum started yesterday. Pt states lymph nodes also swollen. Denies fever, SOB, chest pain or chills. Using Flonase and Zyrtec with relief.  Has been fully COVID vaccinated since March.  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  ROS: See pertinent positives and negatives per HPI.  Patient Active Problem List   Diagnosis Date Noted  . Irritable bowel syndrome with diarrhea 05/04/2018  . GAD (generalized anxiety disorder) 01/10/2018  . History of fatty infiltration of liver 01/10/2018  . Non-cardiac chest pain 01/10/2018  . Vestibular nerve damage 01/10/2018  . Nonulcer dyspepsia 01/10/2018  . Obstructive sleep apnea syndrome, severe 10/19/2016  . Class 2 obesity due to excess calories without serious comorbidity with body mass index (BMI) of 35.0 to 35.9 in adult 10/21/2014  . Acne rosacea 06/07/2014  . Drug-induced gynecomastia 02/19/2014  . Hypogonadotropic hypogonadism in male [facility]) 02/19/2014  . Mixed hyperlipidemia 05/10/2008  . Allergic rhinitis 05/10/2008    Social History   Tobacco Use  . Smoking status: Heavy Tobacco Smoker    Packs/day: 1.00    Years: 12.00    Pack years: 12.00    Types: Cigarettes    Last attempt to quit: 03/08/2007    Years since quitting: 13.1  . Smokeless tobacco: Never Used  . Tobacco comment: restarted 2 cig/day 2019; doesn't want to quit  Substance Use Topics  . Alcohol use: Yes    Alcohol/week: 2.0 standard drinks    Types: 1 Glasses of wine, 1 Cans of beer per week    Current Outpatient Medications:  .  cetirizine (ZYRTEC) 10 MG tablet, Take 10 mg by mouth daily., Disp: , Rfl:  .  clonazePAM (KLONOPIN) 0.5 MG tablet, Take 1 tablet (0.5 mg total) by mouth 2 (two) times daily as needed. for anxiety, Disp: 60 tablet, Rfl: 3 .  esomeprazole (NEXIUM) 40 MG capsule, Take 20 mg by mouth daily., Disp: , Rfl:  .  ketoconazole (NIZORAL) 2 % shampoo, Apply topically 2 (two) times a week. As needed, Disp: 120 mL, Rfl: 3 .  Omega-3 Fatty Acids (FISH OIL) 1000 MG CAPS, Take 1 capsule (1,000 mg total) by mouth 2 (two) times daily., Disp: , Rfl: 0 .  PARoxetine (PAXIL) 20 MG tablet, Take 1 tablet (20 mg total) by mouth every morning., Disp: 90 tablet, Rfl: 3 .  simvastatin (ZOCOR) 10 MG tablet, TAKE 1 TABLET BY MOUTH AT BEDTIME, Disp: 90 tablet, Rfl: 3 .  valACYclovir (VALTREX) 1000 MG tablet, Take 2 tablets (2,000 mg total) by mouth 2 (two) times daily. Once, as needed for cold sores, Disp: 20 tablet, Rfl: 2 .  amoxicillin (AMOXIL) 875 MG tablet, Take 1 tablet (875 mg total) by mouth 2 (two) times daily., Disp: 20 tablet,  Rfl: 0  Allergies  Allergen Reactions  . Nitroglycerin Other (See Comments)    Severe bradycardia  . Bupropion Other (See Comments)  . Dust Mite Extract Rash    Objective:   VITALS: Per patient if applicable, see vitals. GENERAL: Alert, appears well and in no acute distress. HEENT: Atraumatic, conjunctiva clear, no obvious abnormalities on inspection of external nose and ears. NECK: Normal movements of the head and neck. CARDIOPULMONARY: No increased WOB. Speaking in clear sentences. I:E ratio WNL.  MS: Moves all visible  extremities without noticeable abnormality. PSYCH: Pleasant and cooperative, well-groomed. Speech normal rate and rhythm. Affect is appropriate. Insight and judgement are appropriate. Attention is focused, linear, and appropriate.  NEURO: CN grossly intact. Oriented as arrived to appointment on time with no prompting. Moves both UE equally.  SKIN: No obvious lesions, wounds, erythema, or cyanosis noted on face or hands.  Assessment and Plan:   [name] was seen today for sinus problem.  Diagnoses and all orders for this visit:  Pharyngitis, unspecified etiology No red flags on discussion. Fully COVID-19 vaccinated, so not likely COVID, however he does understand that we cannot completely rule this out without testing. Offered car visit for strep test but patient declined. Suspect likely viral etiology, however did give safety net rx for amoxicillin should symptoms progress or not improve with supportive care. Discussed taking medications as prescribed. Reviewed return precautions including fever, SOB, worsening cough or other concerns. Push fluids and rest. I recommend that patient follow-up if symptoms worsen or persist despite treatment x 7-10 days, sooner if needed.   Other orders -     Discontinue: amoxicillin (AMOXIL) 875 MG tablet; Take 1 tablet (875 mg total) by mouth 2 (two) times daily. -     amoxicillin (AMOXIL) 875 MG tablet; Take 1 tablet (875 mg total) by mouth 2 (two) times daily.    . Reviewed expectations re: course of current medical issues. . Discussed self-management of symptoms. . Outlined signs and symptoms indicating need for more acute intervention. . Patient verbalized understanding and all questions were answered. Marland Kitchen Health Maintenance issues including appropriate healthy diet, exercise, and smoking avoidance were discussed with patient. . See orders for this visit as documented in the electronic medical record.  I discussed the assessment and treatment plan with  the patient. The patient was provided an opportunity to ask questions and all were answered. The patient agreed with the plan and demonstrated an understanding of the instructions.   The patient was advised to call back or seek an in-person evaluation if the symptoms worsen or if the condition fails to improve as anticipated.   CMA or LPN served as scribe during this visit. History, Physical, and Plan performed by medical provider. The above documentation has been reviewed and is accurate and complete.   [place], [place] 05/14/2020

## 2020-05-23 ENCOUNTER — Other Ambulatory Visit: Payer: Self-pay | Admitting: Family Medicine

## 2020-05-23 DIAGNOSIS — G4733 Obstructive sleep apnea (adult) (pediatric): Secondary | ICD-10-CM | POA: Diagnosis not present

## 2020-05-29 DIAGNOSIS — G4710 Hypersomnia, unspecified: Secondary | ICD-10-CM | POA: Diagnosis not present

## 2020-05-29 DIAGNOSIS — J3089 Other allergic rhinitis: Secondary | ICD-10-CM | POA: Diagnosis not present

## 2020-05-29 DIAGNOSIS — G4733 Obstructive sleep apnea (adult) (pediatric): Secondary | ICD-10-CM | POA: Diagnosis not present

## 2020-06-03 ENCOUNTER — Encounter: Payer: Self-pay | Admitting: Family Medicine

## 2020-06-03 ENCOUNTER — Other Ambulatory Visit: Payer: Self-pay

## 2020-06-03 MED ORDER — CLONAZEPAM 0.5 MG PO TABS: 0.5000 mg | ORAL_TABLET | Freq: Two times a day (BID) | ORAL | 0 refills | Status: DC | PRN

## 2020-06-03 NOTE — Telephone Encounter (Signed)
Scheduled pt CPE for first available on 10/17/2020

## 2020-06-03 NOTE — Telephone Encounter (Signed)
Pt overdue for CPE. Please call to schedule for next available.  Thanks.

## 2020-06-03 NOTE — Telephone Encounter (Signed)
[place] Database Verified LR: 03-18-2020 Qty: 1 Last office visit: 06-21-2019. Had an acute visit with Aldona Bar on 05-14-2020 Upcoming appointment: No pending appt

## 2020-06-04 ENCOUNTER — Other Ambulatory Visit: Payer: Self-pay

## 2020-07-29 ENCOUNTER — Other Ambulatory Visit: Payer: Self-pay | Admitting: Family Medicine

## 2020-08-05 ENCOUNTER — Ambulatory Visit: Payer: BC Managed Care – PPO | Admitting: Family Medicine

## 2020-08-24 ENCOUNTER — Other Ambulatory Visit: Payer: Self-pay | Admitting: Family Medicine

## 2020-08-25 MED ORDER — CLONAZEPAM 0.5 MG PO TABS
0.5000 mg | ORAL_TABLET | Freq: Two times a day (BID) | ORAL | 0 refills | Status: DC | PRN
Start: 2020-08-25 — End: 2020-09-16

## 2020-08-25 NOTE — Telephone Encounter (Signed)
LR: 06-03-2020 Qty: 60 with 0 refills  Last office visit: 05-14-2020 (video visit with Aldona Bar), 06-21-2019 Jonni Sanger) Upcoming appointment: 10-17-2020

## 2020-08-26 DIAGNOSIS — G4733 Obstructive sleep apnea (adult) (pediatric): Secondary | ICD-10-CM | POA: Diagnosis not present

## 2020-09-12 ENCOUNTER — Encounter: Payer: Self-pay | Admitting: Family Medicine

## 2020-09-12 ENCOUNTER — Other Ambulatory Visit: Payer: Self-pay

## 2020-09-12 ENCOUNTER — Ambulatory Visit: Payer: BC Managed Care – PPO | Admitting: Family Medicine

## 2020-09-12 VITALS — BP 132/80 | HR 67 | Temp 98.7°F | Wt 259.4 lb

## 2020-09-12 DIAGNOSIS — E782 Mixed hyperlipidemia: Secondary | ICD-10-CM

## 2020-09-12 DIAGNOSIS — K760 Fatty (change of) liver, not elsewhere classified: Secondary | ICD-10-CM

## 2020-09-12 DIAGNOSIS — E230 Hypopituitarism: Secondary | ICD-10-CM | POA: Diagnosis not present

## 2020-09-12 DIAGNOSIS — Z23 Encounter for immunization: Secondary | ICD-10-CM | POA: Diagnosis not present

## 2020-09-12 DIAGNOSIS — R5383 Other fatigue: Secondary | ICD-10-CM | POA: Diagnosis not present

## 2020-09-12 NOTE — Progress Notes (Signed)
Subjective  CC:  Chief Complaint  Patient presents with  . Fatigue    possible low testosterone - no longer taking testosterone shots - patient choice  . weight management    unable to loss weight  . Premature Ejaculation    HPI: Shannon Ibarra is a 44 y.o. male who presents to the office today to address the problems listed above in the chief complaint.  44 year old male with history of low T who was formally treated with testosterone supplementation several years back and managed by urology and endocrinology presents due to symptoms of fatigue, weight gain, premature ejaculation, decreased energy.  He is concerned that his testosterone levels are again low.  He would be interested in starting supplementation again.  He works out regularly, mainly Editor, commissioning.  He reports that his diet is unchanged.  However he has gained 20 pounds.  He denies heat intolerance, lower extremity edema, rash or mood problems.  He has been stable on Paxil for multiple years for anxiety disorder.  His relationship is good with his wife, however he is stress due to COVID pandemic restrictions.  He denies fevers, chills, arthralgias, rash.  He denies hair or skin changes.  Health maintenance: He has an appointment for an upcoming physical in October.  Wt Readings from Last 3 Encounters:  09/12/20 259 lb 6.4 oz (117.7 kg)  05/04/18 230 lb (104.3 kg)  01/10/18 236 lb 4 oz (107.2 kg)   Lab Results  Component Value Date   CHOL 161 11/20/2019   HDL 36.30 (L) 11/20/2019   LDLCALC 75 02/27/2019   LDLDIRECT 88.0 11/20/2019   TRIG 219.0 (H) 11/20/2019   CHOLHDL 4 11/20/2019   Lab Results  Component Value Date   ALT 69 (H) 11/20/2019   AST 46 (H) 11/20/2019   ALKPHOS 54 11/20/2019   BILITOT 0.7 11/20/2019     Assessment  1. Fatigue, unspecified type   2. Mixed hyperlipidemia   3. Hypogonadotropic hypogonadism in male [facility])   4. Hepatic steatosis   5. Morbid obesity ([place])   6. Need for  immunization against influenza      Plan   Fatigue and history of low T: Recommend returning for morning labs to recheck testosterone levels.  We will also check for other causes of fatigue including anemia and hypothyroidism.  We will follow-up after lab testing is back.  He does have an appointment with endocrinology again next month.  Hyperlipidemia on statin.  Recheck levels.  Hepatic steatosis and lower obesity: Recheck LFTs.  They have been elevated in the past.  Flu shot today  Follow up: For follow-up and CPE as scheduled 10/17/2020  Orders Placed This Encounter  Procedures  . Flu Vaccine QUAD 36+ mos IM  . Testosterone Total,Free,Bio, Males  . TSH  . CBC with Differential/Platelet  . COMPLETE METABOLIC PANEL WITH GFR  . Lipid panel   No orders of the defined types were placed in this encounter.     I reviewed the patients updated PMH, FH, and SocHx.    Patient Active Problem List   Diagnosis Date Noted  . GAD (generalized anxiety disorder) 01/10/2018    Priority: High  . Vestibular nerve damage 01/10/2018    Priority: High  . Obstructive sleep apnea syndrome, severe 10/19/2016    Priority: High  . Mixed hyperlipidemia 05/10/2008    Priority: High  . Irritable bowel syndrome with diarrhea 05/04/2018    Priority: Medium  . History of fatty infiltration of liver  01/10/2018    Priority: Medium  . Nonulcer dyspepsia 01/10/2018    Priority: Medium  . Morbid obesity ([place]) 10/21/2014    Priority: Medium  . Hypogonadotropic hypogonadism in male [facility]) 02/19/2014    Priority: Medium  . Acne rosacea 06/07/2014    Priority: Low  . Drug-induced gynecomastia 02/19/2014    Priority: Low  . Allergic rhinitis 05/10/2008    Priority: Low  . Non-cardiac chest pain 01/10/2018   Current Meds  Medication Sig  . cetirizine (ZYRTEC) 10 MG tablet Take 10 mg by mouth daily.  . clonazePAM (KLONOPIN) 0.5 MG tablet Take 1 tablet (0.5 mg total) by mouth 2 (two) times daily as  needed. for anxiety  . esomeprazole (NEXIUM) 40 MG capsule Take 20 mg by mouth daily.  Marland Kitchen ketoconazole (NIZORAL) 2 % shampoo Apply topically 2 (two) times a week. As needed  . Omega-3 Fatty Acids (FISH OIL) 1000 MG CAPS Take 1 capsule (1,000 mg total) by mouth 2 (two) times daily.  . simvastatin (ZOCOR) 10 MG tablet TAKE 1 TABLET BY MOUTH EVERY NIGHT AT BEDTIME  . valACYclovir (VALTREX) 1000 MG tablet Take 2 tablets (2,000 mg total) by mouth 2 (two) times daily. Once, as needed for cold sores  . [DISCONTINUED] PARoxetine (PAXIL) 20 MG tablet Take 1 tablet (20 mg total) by mouth every morning. Please schedule follow up appt with Dr. Jonni Sanger for further refills [phone]    Allergies: Patient is allergic to nitroglycerin, bupropion, and dust mite extract. Family History: Patient family history includes Colon polyps in his father; Hypertension in his brother and mother; Throat cancer in his father. Social History:  Patient  reports that he has been smoking cigarettes. He has a 12.00 pack-year smoking history. He has never used smokeless tobacco. He reports current alcohol use of about 2.0 standard drinks of alcohol per week. He reports that he does not use drugs.  Review of Systems: Constitutional: Negative for fever malaise or anorexia Cardiovascular: negative for chest pain Respiratory: negative for SOB or persistent cough Gastrointestinal: negative for abdominal pain  Objective  Vitals: BP 132/80   Pulse 67   Temp 98.7 F (37.1 C) (Temporal)   Wt 259 lb 6.4 oz (117.7 kg)   SpO2 95%   BMI 40.63 kg/m  General: no acute distress , A&Ox3, central obesity noted HEENT: PEERL, conjunctiva normal, neck is supple, no thyromegaly Cardiovascular:  RRR without murmur or gallop.  Respiratory:  Good breath sounds bilaterally, CTAB with normal respiratory effort Skin:  Warm, no rashes No lower extremity edema     Commons side effects, risks, benefits, and alternatives for medications and  treatment plan prescribed today were discussed, and the patient expressed understanding of the given instructions. Patient is instructed to call or message via MyChart if he/she has any questions or concerns regarding our treatment plan. No barriers to understanding were identified. We discussed Red Flag symptoms and signs in detail. Patient expressed understanding regarding what to do in case of urgent or emergency type symptoms.   Medication list was reconciled, printed and provided to the patient in AVS. Patient instructions and summary information was reviewed with the patient as documented in the AVS. This note was prepared with assistance of Dragon voice recognition software. Occasional wrong-word or sound-a-like substitutions may have occurred due to the inherent limitations of voice recognition software  This visit occurred during the SARS-CoV-2 public health emergency.  Safety protocols were in place, including screening questions prior to the visit, additional usage of  staff PPE, and extensive cleaning of exam room while observing appropriate contact time as indicated for disinfecting solutions.

## 2020-09-12 NOTE — Patient Instructions (Signed)
Please follow up as scheduled for your next visit with me: 10/17/2020  Please return next week in the morning fasting for your lab work.   I will release your lab results to you on your MyChart account with further instructions. Please reply with any questions.   If you have any questions or concerns, please don't hesitate to send me a message via MyChart or call the office at [phone]. Thank you for visiting with Shannon Ibarra today! It's our pleasure caring for you.

## 2020-09-13 ENCOUNTER — Other Ambulatory Visit: Payer: Self-pay | Admitting: Family Medicine

## 2020-09-15 ENCOUNTER — Other Ambulatory Visit: Payer: BC Managed Care – PPO

## 2020-09-15 ENCOUNTER — Other Ambulatory Visit: Payer: Self-pay

## 2020-09-15 DIAGNOSIS — E230 Hypopituitarism: Secondary | ICD-10-CM

## 2020-09-15 DIAGNOSIS — K760 Fatty (change of) liver, not elsewhere classified: Secondary | ICD-10-CM | POA: Diagnosis not present

## 2020-09-15 DIAGNOSIS — E782 Mixed hyperlipidemia: Secondary | ICD-10-CM | POA: Diagnosis not present

## 2020-09-15 DIAGNOSIS — R5383 Other fatigue: Secondary | ICD-10-CM

## 2020-09-16 ENCOUNTER — Other Ambulatory Visit: Payer: Self-pay | Admitting: Family Medicine

## 2020-09-16 LAB — CBC WITH DIFFERENTIAL/PLATELET
Absolute Monocytes: 443 cells/uL (ref 200–950)
Basophils Absolute: 38 cells/uL (ref 0–200)
Basophils Relative: 0.7 %
Eosinophils Absolute: 119 cells/uL (ref 15–500)
Eosinophils Relative: 2.2 %
HCT: 41.4 % (ref 38.5–50.0)
Hemoglobin: 14.1 g/dL (ref 13.2–17.1)
Lymphs Abs: 1420 cells/uL (ref 850–3900)
MCH: 32.2 pg (ref 27.0–33.0)
MCHC: 34.1 g/dL (ref 32.0–36.0)
MCV: 94.5 fL (ref 80.0–100.0)
MPV: 10.8 fL (ref 7.5–12.5)
Monocytes Relative: 8.2 %
Neutro Abs: 3380 cells/uL (ref 1500–7800)
Neutrophils Relative %: 62.6 %
Platelets: 213 10*3/uL (ref 140–400)
RBC: 4.38 10*6/uL (ref 4.20–5.80)
RDW: 12.5 % (ref 11.0–15.0)
Total Lymphocyte: 26.3 %
WBC: 5.4 10*3/uL (ref 3.8–10.8)

## 2020-09-16 LAB — COMPLETE METABOLIC PANEL WITH GFR
AG Ratio: 1.9 (calc) (ref 1.0–2.5)
ALT: 52 U/L — ABNORMAL HIGH (ref 9–46)
AST: 37 U/L (ref 10–40)
Albumin: 4.6 g/dL (ref 3.6–5.1)
Alkaline phosphatase (APISO): 46 U/L (ref 36–130)
BUN: 19 mg/dL (ref 7–25)
CO2: 30 mmol/L (ref 20–32)
Calcium: 9.4 mg/dL (ref 8.6–10.3)
Chloride: 99 mmol/L (ref 98–110)
Creat: 1.03 mg/dL (ref 0.60–1.35)
GFR, Est African American: 101 mL/min/{1.73_m2} (ref >=60)
GFR, Est Non African American: 87 mL/min/{1.73_m2} (ref >=60)
Globulin: 2.4 g/dL (calc) (ref 1.9–3.7)
Glucose, Bld: 83 mg/dL (ref 65–99)
Potassium: 4.4 mmol/L (ref 3.5–5.3)
Sodium: 136 mmol/L (ref 135–146)
Total Bilirubin: 0.7 mg/dL (ref 0.2–1.2)
Total Protein: 7 g/dL (ref 6.1–8.1)

## 2020-09-16 LAB — TESTOSTERONE TOTAL,FREE,BIO, MALES
Albumin: 4.6 g/dL (ref 3.6–5.1)
Sex Hormone Binding: 25 nmol/L (ref 10–50)
Testosterone, Bioavailable: 114.2 ng/dL (ref >=110.0)
Testosterone, Free: 54.4 pg/mL (ref 46.0–224.0)
Testosterone: 343 ng/dL (ref 250–827)

## 2020-09-16 LAB — LIPID PANEL
Cholesterol: 168 mg/dL (ref <200)
HDL: 43 mg/dL (ref >=40)
LDL Cholesterol (Calc): 92 mg/dL (calc)
Non-HDL Cholesterol (Calc): 125 mg/dL (calc) (ref <130)
Total CHOL/HDL Ratio: 3.9 (calc) (ref <5.0)
Triglycerides: 250 mg/dL — ABNORMAL HIGH (ref <150)

## 2020-09-16 LAB — TSH: TSH: 1.52 mIU/L (ref 0.40–4.50)

## 2020-10-17 ENCOUNTER — Encounter: Payer: Self-pay | Admitting: Family Medicine

## 2020-10-17 ENCOUNTER — Ambulatory Visit (INDEPENDENT_AMBULATORY_CARE_PROVIDER_SITE_OTHER): Payer: BC Managed Care – PPO | Admitting: Family Medicine

## 2020-10-17 ENCOUNTER — Other Ambulatory Visit: Payer: Self-pay

## 2020-10-17 VITALS — BP 120/68 | HR 72 | Temp 98.6°F | Ht 67.0 in | Wt 257.0 lb

## 2020-10-17 DIAGNOSIS — Z0000 Encounter for general adult medical examination without abnormal findings: Secondary | ICD-10-CM

## 2020-10-17 DIAGNOSIS — E782 Mixed hyperlipidemia: Secondary | ICD-10-CM

## 2020-10-17 DIAGNOSIS — G4733 Obstructive sleep apnea (adult) (pediatric): Secondary | ICD-10-CM

## 2020-10-17 DIAGNOSIS — F411 Generalized anxiety disorder: Secondary | ICD-10-CM

## 2020-10-17 DIAGNOSIS — Z8719 Personal history of other diseases of the digestive system: Secondary | ICD-10-CM

## 2020-10-17 MED ORDER — PAROXETINE HCL 20 MG PO TABS
20.0000 mg | ORAL_TABLET | Freq: Every day | ORAL | 3 refills | Status: DC
Start: 2020-10-17 — End: 2021-08-24

## 2020-10-17 MED ORDER — SIMVASTATIN 10 MG PO TABS
10.0000 mg | ORAL_TABLET | Freq: Every day | ORAL | 3 refills | Status: DC
Start: 2020-10-17 — End: 2021-10-19

## 2020-10-17 MED ORDER — CLONAZEPAM 0.5 MG PO TABS
0.5000 mg | ORAL_TABLET | Freq: Every day | ORAL | 3 refills | Status: DC
Start: 2020-10-17 — End: 2021-03-30

## 2020-10-17 NOTE — Patient Instructions (Signed)
Please return in 12 months for your annual complete physical; please come fasting.  Ive refilled your klonopin and simvastatin through your mail order pharmacy; both with 3 month supplies.   Take it easy!  If you have any questions or concerns, please don't hesitate to send me a message via MyChart or call the office at [phone]. Thank you for visiting with [place] today! It's our pleasure caring for you.

## 2020-10-17 NOTE — Progress Notes (Signed)
Subjective  Chief Complaint  Patient presents with  . Annual Exam    non-fasting   HPI: Shannon Ibarra is a 44 y.o. male who presents to [facility] today for a Male Wellness Visit.   Wellness Visit: annual visit with health maintenance review and exam    Health maintenance is currently up-to-date.  Immunizations are up-to-date.  He continues to exercise with weightlifting regularly.  Current medical problems are well controlled.  We reviewed lab work that he had at his last visit.  Has visit with endocrinology to discuss fatigue and hypogonadism.  Uses CPAP machine regularly and it works well to help him.  He reports effects.  Anxiety is controlled on daily Klonopin for multiple years and Paxil.  Medications refilled  Hyperlipidemia stable.  Tolerates simvastatin Lifestyle: Body mass index is 40.25 kg/m. Wt Readings from Last 3 Encounters:  10/17/20 257 lb (116.6 kg)  09/12/20 259 lb 6.4 oz (117.7 kg)  05/04/18 230 lb (104.3 kg)     Patient Active Problem List   Diagnosis Date Noted  . GAD (generalized anxiety disorder) 01/10/2018  . Vestibular nerve damage 01/10/2018  . Obstructive sleep apnea syndrome, severe 10/19/2016  . Mixed hyperlipidemia 05/10/2008  . Irritable bowel syndrome with diarrhea 05/04/2018  . History of fatty infiltration of liver 01/10/2018  . Nonulcer dyspepsia 01/10/2018  . Morbid obesity ([place]) 10/21/2014  . Hypogonadotropic hypogonadism in male [facility]) 02/19/2014  . Acne rosacea 06/07/2014  . Drug-induced gynecomastia 02/19/2014  . Allergic rhinitis 05/10/2008  . Non-cardiac chest pain 01/10/2018   Health Maintenance  Topic Date Due  . Samul Dada  12/06/2023  . INFLUENZA VACCINE  Completed  . COVID-19 Vaccine  Completed  . Hepatitis C Screening  Completed  . HIV Screening  Completed   Immunization History  Administered Date(s) Administered  . Influenza Split 10/17/2015  . Influenza, Quadrivalent,  Recombinant, Inj, Pf 09/01/2018  . Influenza,inj,Quad PF,6+ Mos 11/03/2017, 09/12/2020  . PFIZER SARS-COV-2 Vaccination 03/05/2020, 03/26/2020, 10/13/2020  . Td 12/27/2002  . Tdap 12/05/2013   We updated and reviewed the patient's past history in detail and it is documented below. Allergies: Patient is allergic to nitroglycerin, bupropion, and dust mite extract. Past Medical History  has a past medical history of Acne, Allergic rhinitis, Anxiety, Fatty liver, Hyperlipidemia (11/2282013), Irritable bowel syndrome with diarrhea (05/04/2018), Nonulcer dyspepsia (01/10/2018), Rosacea, acne (06/07/2014), Vestibular nerve damage (01/10/2018), and Vestibular neuritis (10/13/2017). Past Surgical History  has a past surgical history that includes Tonsillectomy. Social History Patient  reports that he has been smoking cigarettes. He has a 12.00 pack-year smoking history. He has never used smokeless tobacco. He reports current alcohol use of about 2.0 standard drinks of alcohol per week. He reports that he does not use drugs. Family History Patient family history includes Colon polyps in his father; Hypertension in his brother and mother; Throat cancer in his father. Review of Systems: Constitutional: negative for fever or malaise Ophthalmic: negative for photophobia, double vision or loss of vision Cardiovascular: negative for chest pain, dyspnea on exertion, or new LE swelling Respiratory: negative for SOB or persistent cough Gastrointestinal: negative for abdominal pain, change in bowel habits or melena Genitourinary: negative for dysuria or gross hematuria Musculoskeletal: negative for new gait disturbance or muscular weakness Integumentary: negative for new or persistent rashes, no breast lumps Neurological: negative for TIA or stroke symptoms Psychiatric: negative for SI or delusions Allergic/Immunologic: negative for hives  Patient Care Team    Relationship Specialty  Notifications Start End    Leamon Arnt, MD PCP - General Family Medicine  01/10/18   Janie Morning, MD  Gastroenterology  01/10/18   Susette Racer, MD Consulting Physician Endocrinology  01/10/18   Dionne Milo, MD Consulting Physician Pulmonary Disease  01/10/18    Objective  Vitals: BP 120/68   Pulse 72   Temp 98.6 F (37 C) (Temporal)   Ht 5' 7"  (1.702 m)   Wt 257 lb (116.6 kg)   SpO2 96%   BMI 40.25 kg/m  General:  Well developed, well nourished, no acute distress  Psych:  Alert and orientedx3,normal mood and affect HEENT:  Normocephalic, atraumatic, non-icteric sclera, PERRL, oropharynx is clear without mass or exudate, supple neck without adenopathy, mass or thyromegaly Cardiovascular:  Normal S1, S2, RRR without gallop, rub or murmur, nondisplaced PMI, +2 distal pulses in bilateral upper and lower extremities. Respiratory:  Good breath sounds bilaterally, CTAB with normal respiratory effort Gastrointestinal: normal bowel sounds, soft, non-tender, no noted masses. No HSM MSK: no deformities, contusions. Joints are without erythema or swelling. Spine and CVA region are nontender Skin:  Warm, no rashes or suspicious lesions noted Neurologic:    Mental status is normal. o tremor GU: No inguinal hernias or adenopathy are appreciated bilaterally  Assessment  1. Annual physical exam   2. GAD (generalized anxiety disorder)   3. Mixed hyperlipidemia   4. Obstructive sleep apnea syndrome, severe   5. History of fatty infiltration of liver      Plan  Male Wellness Visit:  Age appropriate Health Maintenance and Prevention measures were discussed with patient. Included topics are cancer screening recommendations, ways to keep healthy (see AVS) including dietary and exercise recommendations, regular eye and dental care, use of seat belts, and avoidance of moderate alcohol use and tobacco use.   BMI: discussed patient's BMI and encouraged positive lifestyle modifications to help get to or maintain  a target BMI.  HM needs and immunizations were addressed and ordered. See below for orders. See HM and immunization section for updates.  Routine labs and screening tests ordered including cmp, cbc and lipids where appropriate.  Discussed recommendations regarding Vit D and calcium supplementation (see AVS)  Follow up: No follow-ups on file.   Commons side effects, risks, benefits, and alternatives for medications and treatment plan prescribed today were discussed, and the patient expressed understanding of the given instructions. Patient is instructed to call or message via MyChart if he/she has any questions or concerns regarding our treatment plan. No barriers to understanding were identified. We discussed Red Flag symptoms and signs in detail. Patient expressed understanding regarding what to do in case of urgent or emergency type symptoms.   Medication list was reconciled, printed and provided to the patient in AVS. Patient instructions and summary information was reviewed with the patient as documented in the AVS. This note was prepared with assistance of Dragon voice recognition software. Occasional wrong-word or sound-a-like substitutions may have occurred due to the inherent limitations of voice recognition software This visit occurred during the SARS-CoV-2 public health emergency.  Safety protocols were in place, including screening questions prior to the visit, additional usage of staff PPE, and extensive cleaning of exam room while observing appropriate contact time as indicated for disinfecting solutions.   No orders of the defined types were placed in this encounter.  Meds ordered this encounter  Medications  . PARoxetine (PAXIL) 20 MG tablet    Sig: Take 1 tablet (20 mg total) by  mouth daily.    Dispense:  90 tablet    Refill:  3  . clonazePAM (KLONOPIN) 0.5 MG tablet    Sig: Take 1 tablet (0.5 mg total) by mouth daily.    Dispense:  90 tablet    Refill:  3  . simvastatin  (ZOCOR) 10 MG tablet    Sig: Take 1 tablet (10 mg total) by mouth at bedtime.    Dispense:  90 tablet    Refill:  3

## 2020-10-20 DIAGNOSIS — E230 Hypopituitarism: Secondary | ICD-10-CM | POA: Diagnosis not present

## 2020-10-20 DIAGNOSIS — Z6835 Body mass index (BMI) 35.0-35.9, adult: Secondary | ICD-10-CM | POA: Diagnosis not present

## 2020-10-20 DIAGNOSIS — E6609 Other obesity due to excess calories: Secondary | ICD-10-CM | POA: Diagnosis not present

## 2020-10-20 DIAGNOSIS — E291 Testicular hypofunction: Secondary | ICD-10-CM | POA: Diagnosis not present

## 2020-10-29 ENCOUNTER — Telehealth: Payer: Self-pay

## 2020-10-29 NOTE — Telephone Encounter (Signed)
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 Pt takes medication as prescribed 2. Pt doesn't take medication within 8 hours of driving. 3. Pt is safe to drive a commercial vehicle    It can be faxed here [phone] Or uploaded into the Media sectoin on [employer], as they will have access to it there.

## 2020-10-29 NOTE — Telephone Encounter (Signed)
Pt said he needs this ASAP because it is for a new job. Pt asked if Dr. Jonni Sanger would call and speak with him.

## 2020-10-30 ENCOUNTER — Encounter: Payer: Self-pay | Admitting: Family Medicine

## 2020-10-30 ENCOUNTER — Telehealth: Payer: Self-pay

## 2020-10-30 NOTE — Telephone Encounter (Signed)
Yes, can write letter documenting these things.

## 2020-10-30 NOTE — Telephone Encounter (Signed)
error 

## 2020-10-30 NOTE — Telephone Encounter (Signed)
Letter written and sent via mychart

## 2020-10-30 NOTE — Telephone Encounter (Signed)
OK to write letter?

## 2020-10-31 MED ORDER — MINOCYCLINE HCL 100 MG PO CAPS: 100.0000 mg | ORAL_CAPSULE | Freq: Two times a day (BID) | ORAL | 0 refills | Status: DC

## 2020-11-24 DIAGNOSIS — G4733 Obstructive sleep apnea (adult) (pediatric): Secondary | ICD-10-CM | POA: Diagnosis not present

## 2020-12-25 DIAGNOSIS — G4733 Obstructive sleep apnea (adult) (pediatric): Secondary | ICD-10-CM | POA: Diagnosis not present

## 2021-01-30 ENCOUNTER — Other Ambulatory Visit: Payer: Self-pay

## 2021-01-30 ENCOUNTER — Ambulatory Visit: Payer: BC Managed Care – PPO | Admitting: Family Medicine

## 2021-01-30 ENCOUNTER — Encounter: Payer: Self-pay | Admitting: Family Medicine

## 2021-01-30 VITALS — BP 124/82 | HR 66 | Temp 98.3°F | Resp 16 | Wt 265.4 lb

## 2021-01-30 DIAGNOSIS — Z1211 Encounter for screening for malignant neoplasm of colon: Secondary | ICD-10-CM | POA: Diagnosis not present

## 2021-01-30 DIAGNOSIS — L0591 Pilonidal cyst without abscess: Secondary | ICD-10-CM | POA: Diagnosis not present

## 2021-01-30 DIAGNOSIS — Z1212 Encounter for screening for malignant neoplasm of rectum: Secondary | ICD-10-CM | POA: Diagnosis not present

## 2021-01-30 MED ORDER — CLINDAMYCIN HCL 300 MG PO CAPS: 300.0000 mg | ORAL_CAPSULE | Freq: Three times a day (TID) | ORAL | 0 refills | Status: DC

## 2021-01-30 NOTE — Progress Notes (Signed)
Subjective  CC:  Chief Complaint  Patient presents with  . Cyst    On his bottom and pt states that has popped.     HPI: Shannon Ibarra is a 45 y.o. male who presents to the office today to address the problems listed above in the chief complaint.  44 year old male who recently stopped smoking completely again several months ago and is drinking less and is working on weight loss.  Riding the [employer] bicycle and noted pain in his backside.  He thought this was related to bicycling although the site that was sore was at the top of his intergluteal cleft.  He noted redness and swelling, last night it ruptured.  It now feels better.  He has had no fevers or chills.  He has not noticed any redness in the area.  No trauma.  Health maintenance: Eligible for colorectal cancer screening.  1. Infected pilonidal cyst   2. Screening for colorectal cancer      Plan   Infected pilonidal cyst: Likely irritated due to rubbing from undergarments while bicycling.  Recommend antibiotics and routine wound care.  Follow-up if not improving.  Recommend protection with bandages in the future.  Counseling Dr. Colorectal cancer screening options.  Patient elects Cologuard.  Ordered.  He is an average risk patient.  Follow up: October 2022 for complete physical Visit date not found  Orders Placed This Encounter  Procedures  . Cologuard   Meds ordered this encounter  Medications  . clindamycin (CLEOCIN) 300 MG capsule    Sig: Take 1 capsule (300 mg total) by mouth 3 (three) times daily.    Dispense:  21 capsule    Refill:  0      I reviewed the patients updated PMH, FH, and SocHx.    Patient Active Problem List   Diagnosis Date Noted  . GAD (generalized anxiety disorder) 01/10/2018    Priority: High  . Vestibular nerve damage 01/10/2018    Priority: High  . Obstructive sleep apnea syndrome, severe 10/19/2016    Priority: High  . Mixed hyperlipidemia 05/10/2008    Priority: High  .  Irritable bowel syndrome with diarrhea 05/04/2018    Priority: Medium  . History of fatty infiltration of liver 01/10/2018    Priority: Medium  . Nonulcer dyspepsia 01/10/2018    Priority: Medium  . Morbid obesity ([place]) 10/21/2014    Priority: Medium  . Hypogonadotropic hypogonadism in male [facility]) 02/19/2014    Priority: Medium  . Acne rosacea 06/07/2014    Priority: Low  . Drug-induced gynecomastia 02/19/2014    Priority: Low  . Allergic rhinitis 05/10/2008    Priority: Low  . Non-cardiac chest pain 01/10/2018   Current Meds  Medication Sig  . cetirizine (ZYRTEC) 10 MG tablet Take 10 mg by mouth daily.  . cholecalciferol (VITAMIN D3) 25 MCG (1000 UNIT) tablet Take 1,000 Units by mouth daily.  . clindamycin (CLEOCIN) 300 MG capsule Take 1 capsule (300 mg total) by mouth 3 (three) times daily.  . clonazePAM (KLONOPIN) 0.5 MG tablet Take 1 tablet (0.5 mg total) by mouth daily.  Marland Kitchen esomeprazole (NEXIUM) 40 MG capsule Take 20 mg by mouth daily.  Marland Kitchen ketoconazole (NIZORAL) 2 % shampoo Apply topically 2 (two) times a week. As needed  . minocycline (MINOCIN) 100 MG capsule Take 1 capsule (100 mg total) by mouth 2 (two) times daily.  . Omega-3 Fatty Acids (FISH OIL) 1000 MG CAPS Take 1 capsule (1,000 mg total) by mouth 2 (  two) times daily.  Marland Kitchen PARoxetine (PAXIL) 20 MG tablet Take 1 tablet (20 mg total) by mouth daily.  . simvastatin (ZOCOR) 10 MG tablet Take 1 tablet (10 mg total) by mouth at bedtime.  . valACYclovir (VALTREX) 1000 MG tablet Take 2 tablets (2,000 mg total) by mouth 2 (two) times daily. Once, as needed for cold sores    Allergies: Patient is allergic to nitroglycerin, bupropion, and dust mite extract. Family History: Patient family history includes Colon polyps in his father; Hypertension in his brother and mother; Throat cancer in his father. Social History:  Patient  reports that he quit smoking about 13 years ago. His smoking use included cigarettes. He has a 12.00  pack-year smoking history. He has never used smokeless tobacco. He reports current alcohol use of about 2.0 standard drinks of alcohol per week. He reports that he does not use drugs.  Review of Systems: Constitutional: Negative for fever malaise or anorexia Cardiovascular: negative for chest pain Respiratory: negative for SOB or persistent cough Gastrointestinal: negative for abdominal pain  Objective  Vitals: BP 124/82   Pulse 66   Temp 98.3 F (36.8 C)   Resp 16   Wt 265 lb 6.4 oz (120.4 kg)   SpO2 96%   BMI 41.57 kg/m  General: no acute distress , A&Ox3  Skin:  Warm, no rashes, the top of the intergluteal cleft is a draining infected cyst without surrounding erythema, no fluctuance, minimally tender.  No obvious tracking.     Commons side effects, risks, benefits, and alternatives for medications and treatment plan prescribed today were discussed, and the patient expressed understanding of the given instructions. Patient is instructed to call or message via MyChart if he/she has any questions or concerns regarding our treatment plan. No barriers to understanding were identified. We discussed Red Flag symptoms and signs in detail. Patient expressed understanding regarding what to do in case of urgent or emergency type symptoms.   Medication list was reconciled, printed and provided to the patient in AVS. Patient instructions and summary information was reviewed with the patient as documented in the AVS. This note was prepared with assistance of Dragon voice recognition software. Occasional wrong-word or sound-a-like substitutions may have occurred due to the inherent limitations of voice recognition software  This visit occurred during the SARS-CoV-2 public health emergency.  Safety protocols were in place, including screening questions prior to the visit, additional usage of staff PPE, and extensive cleaning of exam room while observing appropriate contact time as indicated for  disinfecting solutions.

## 2021-01-30 NOTE — Patient Instructions (Signed)
Please return in October 2022 for your annual complete physical; please come fasting.  I recommend the Cologuard test for your colon cancer screening that is due. I have ordered this test for you. The [facility] will soon contact you to verify your insurance, address etc. They will then send you the kit; follow the instructions in the kit and return the kit to [facility]. They will run the test and send the results to me. I will then give you the results. If this test is negative, we recommend repeating a colon cancer screening test in 3 years. If it is positive, I will refer you to a Gastroenterologist so you can get set up for the recommended colonoscopy.  Thank you!  If you have any questions or concerns, please don't hesitate to send me a message via MyChart or call the office at [phone]. Thank you for visiting with [place] today! It's our pleasure caring for you.   Pilonidal Cyst  A pilonidal cyst is a fluid-filled sac that forms beneath the skin near the tailbone, at the top of the crease of the buttocks (pilonidal area). If the cyst is not large and not infected, it may not cause any problems. If the cyst becomes irritated or infected, it may get larger and fill with pus. An infected cyst is called an abscess. A pilonidal abscess may cause pain and swelling, and it may need to be drained or removed. What are the causes? The cause of this condition is not always known. In some cases, a hair that grows into your skin (ingrown hair) may be the cause. What increases the risk? You are more likely to get a pilonidal cyst if you:  Are male.  Have lots of hair near the crease of the buttocks.  Are overweight.  Have a dimple near the crease of the buttocks.  Wear tight clothing.  Do not bathe or shower often.  Sit for long periods of time. What are the signs or symptoms? Signs and symptoms of a pilonidal cyst may include pain, swelling, redness, and warmth in the pilonidal area.  Depending on how big the cyst is, you may be able to feel a lump near your tailbone. If your cyst becomes infected, symptoms may include:  Pus or fluid drainage.  Fever.  Pain, swelling, and redness getting worse.  The lump getting bigger. How is this diagnosed? This condition may be diagnosed based on:  Your symptoms and medical history.  A physical exam.  A blood test to check for infection.  Testing a pus sample, if applicable. How is this treated? If your cyst does not cause symptoms, you may not need any treatment. If your cyst bothers you or is infected, you may need a procedure to drain or remove the cyst. Depending on the size, location, and severity of your cyst, your health care provider may:  Make an incision in the cyst and drain it (incision and drainage).  Open and drain the cyst, and then stitch the wound so that it stays open while it heals (marsupialization). You will be given instructions about how to care for your open wound while it heals.  Remove all or part of the cyst, and then close the wound (cyst removal). You may need to take antibiotic medicines before your procedure. Follow these instructions at home: Medicines  Take over-the-counter and prescription medicines only as told by your health care provider.  If you were prescribed an antibiotic medicine, take it as told by your  health care provider. Do not stop taking the antibiotic even if you start to feel better. General instructions  Keep the area around your pilonidal cyst clean and dry.  If there is fluid or pus draining from your cyst: ? Cover the area with a clean bandage (dressing) as needed. ? Wash the area gently with soap and water. Pat the area dry with a clean towel. Do not rub the area because that may cause bleeding.  Remove hair from the area around the cyst only if your health care provider tells you to do this.  Do not wear tight pants or sit in one position for long periods at a  time.  Keep all follow-up visits as told by your health care provider. This is important. Contact a health care provider if you have:  New redness, swelling, or pain.  A pilonidal cyst is a fluid-filled sac that forms beneath the skin near the tailbone, at the top of the crease of the buttocks (pilonidal area).  If the cyst becomes irritated or infected, it may get larger and fill with pus. An infected cyst is called an abscess.  The cause of this condition is not always known. In some cases, a hair that grows into your skin (ingrown hair) may be the cause.  If your cyst does not cause symptoms, you may not need any treatment. If your cyst bothers you or is infected, you may need a procedure to drain or remove the cyst. This information is not intended to replace advice given to you by your health care provider. Make sure you discuss any questions you have with your health care provider. Document Revised: 12/01/2017 Document Reviewed: 12/01/2017 Elsevier Patient Education  [place].

## 2021-02-09 ENCOUNTER — Encounter: Payer: Self-pay | Admitting: Family Medicine

## 2021-02-09 ENCOUNTER — Ambulatory Visit: Payer: BC Managed Care – PPO | Admitting: Family Medicine

## 2021-02-09 ENCOUNTER — Other Ambulatory Visit: Payer: Self-pay

## 2021-02-09 VITALS — BP 128/80 | HR 65 | Temp 98.4°F | Resp 18 | Wt 259.4 lb

## 2021-02-09 DIAGNOSIS — Z1211 Encounter for screening for malignant neoplasm of colon: Secondary | ICD-10-CM | POA: Diagnosis not present

## 2021-02-09 DIAGNOSIS — Z1212 Encounter for screening for malignant neoplasm of rectum: Secondary | ICD-10-CM

## 2021-02-09 DIAGNOSIS — L0591 Pilonidal cyst without abscess: Secondary | ICD-10-CM | POA: Diagnosis not present

## 2021-02-09 DIAGNOSIS — M542 Cervicalgia: Secondary | ICD-10-CM

## 2021-02-09 MED ORDER — PHENTERMINE HCL 37.5 MG PO TABS: 37.5000 mg | ORAL_TABLET | Freq: Every day | ORAL | 2 refills | Status: DC

## 2021-02-09 NOTE — Progress Notes (Signed)
Subjective  CC:  Chief Complaint  Patient presents with  . Cyst    HPI: Shannon Ibarra is a 45 y.o. male who presents to the office today to address the problems listed above in the chief complaint.  45 year old male who was treated for infected pilonidal cyst about 10 days ago returns due to soreness and recurrence of cyst.  No longer draining or red or very painful but does notice pressure when seated.  See last note.  Morbid obesity: He is working on weight loss.  He is exercising twice daily.  He is restricting calories.  He is inquiring about weight loss medications.  He has a history of generalized anxiety disorder.  No hypertension.  He is not diabetic.  His BMI is greater than 40 but he exercises vigorously and does have an increased muscle bulk.  No heart disease.  Neck pain: Started yesterday.  No injury.  He strength training.  Lots of overhead work.  No radicular symptoms.  Pain with turning head.  Health maintenance: Ordered Cologuard, patient reports that he has not yet received the kit nor got a phone call.  Assessment  1. Pilonidal cyst   2. Morbid obesity ([place])   3. Neck pain   4. Screening for colorectal cancer      Plan   Pilonidal cyst versus dermoid cyst: Refer to general surgery for evaluation.  He would like it excised if possible.  Morbid obesity: BMI is elevated in part due to increased muscle mass however he is obese.  Continue exercise, healthy caloric restrictions and discussed weight loss options.  Recommend phentermine trial.  Education and appropriate use discussed.  Recheck 3 months.  Neck pain: Overuse: Discussed muscle care after workouts, stretching and rolling.  Anti-inflammatory if needed.  We will follow-up on Cologuard testing.  Follow up: 3 months for blood pressure and weight check on phentermine Visit date not found  Orders Placed This Encounter  Procedures  . Ambulatory referral to General Surgery   Meds ordered this encounter   Medications  . phentermine (ADIPEX-P) 37.5 MG tablet    Sig: Take 1 tablet (37.5 mg total) by mouth daily before breakfast.    Dispense:  30 tablet    Refill:  2      I reviewed the patients updated PMH, FH, and SocHx.    Patient Active Problem List   Diagnosis Date Noted  . GAD (generalized anxiety disorder) 01/10/2018    Priority: High  . Vestibular nerve damage 01/10/2018    Priority: High  . Obstructive sleep apnea syndrome, severe 10/19/2016    Priority: High  . Mixed hyperlipidemia 05/10/2008    Priority: High  . Irritable bowel syndrome with diarrhea 05/04/2018    Priority: Medium  . History of fatty infiltration of liver 01/10/2018    Priority: Medium  . Nonulcer dyspepsia 01/10/2018    Priority: Medium  . Morbid obesity ([place]) 10/21/2014    Priority: Medium  . Hypogonadotropic hypogonadism in male [facility]) 02/19/2014    Priority: Medium  . Acne rosacea 06/07/2014    Priority: Low  . Drug-induced gynecomastia 02/19/2014    Priority: Low  . Allergic rhinitis 05/10/2008    Priority: Low  . Non-cardiac chest pain 01/10/2018   Current Meds  Medication Sig  . cetirizine (ZYRTEC) 10 MG tablet Take 10 mg by mouth daily.  . cholecalciferol (VITAMIN D3) 25 MCG (1000 UNIT) tablet Take 1,000 Units by mouth daily.  . clindamycin (CLEOCIN) 300 MG capsule Take  1 capsule (300 mg total) by mouth 3 (three) times daily.  . clonazePAM (KLONOPIN) 0.5 MG tablet Take 1 tablet (0.5 mg total) by mouth daily.  Marland Kitchen esomeprazole (NEXIUM) 40 MG capsule Take 20 mg by mouth daily.  Marland Kitchen ketoconazole (NIZORAL) 2 % shampoo Apply topically 2 (two) times a week. As needed  . minocycline (MINOCIN) 100 MG capsule Take 1 capsule (100 mg total) by mouth 2 (two) times daily.  . Omega-3 Fatty Acids (FISH OIL) 1000 MG CAPS Take 1 capsule (1,000 mg total) by mouth 2 (two) times daily.  Marland Kitchen PARoxetine (PAXIL) 20 MG tablet Take 1 tablet (20 mg total) by mouth daily.  . phentermine (ADIPEX-P) 37.5 MG tablet Take  1 tablet (37.5 mg total) by mouth daily before breakfast.  . simvastatin (ZOCOR) 10 MG tablet Take 1 tablet (10 mg total) by mouth at bedtime.  . valACYclovir (VALTREX) 1000 MG tablet Take 2 tablets (2,000 mg total) by mouth 2 (two) times daily. Once, as needed for cold sores    Allergies: Patient is allergic to nitroglycerin, bupropion, and dust mite extract. Family History: Patient family history includes Colon polyps in his father; Hypertension in his brother and mother; Throat cancer in his father. Social History:  Patient  reports that he quit smoking about 13 years ago. His smoking use included cigarettes. He has a 12.00 pack-year smoking history. He has never used smokeless tobacco. He reports current alcohol use of about 2.0 standard drinks of alcohol per week. He reports that he does not use drugs.  Review of Systems: Constitutional: Negative for fever malaise or anorexia Cardiovascular: negative for chest pain Respiratory: negative for SOB or persistent cough Gastrointestinal: negative for abdominal pain  Objective  Vitals: BP 128/80   Pulse 65   Temp 98.4 F (36.9 C)   Resp 18   Wt 259 lb 6.4 oz (117.7 kg)   SpO2 98%   BMI 40.63 kg/m  General: no acute distress , A&Ox3 HEENT: Tender lower cervical spine and paraspinal muscles with pain when rotates neck.  Has full range of motion. Cor regular rate and rhythm no murmur Skin: Top of gluteal cleft with 5 mm minimally tender cyst without erythema or drainage or fluctuance     Commons side effects, risks, benefits, and alternatives for medications and treatment plan prescribed today were discussed, and the patient expressed understanding of the given instructions. Patient is instructed to call or message via MyChart if he/she has any questions or concerns regarding our treatment plan. No barriers to understanding were identified. We discussed Red Flag symptoms and signs in detail. Patient expressed understanding regarding  what to do in case of urgent or emergency type symptoms.   Medication list was reconciled, printed and provided to the patient in AVS. Patient instructions and summary information was reviewed with the patient as documented in the AVS. This note was prepared with assistance of Dragon voice recognition software. Occasional wrong-word or sound-a-like substitutions may have occurred due to the inherent limitations of voice recognition software  This visit occurred during the SARS-CoV-2 public health emergency.  Safety protocols were in place, including screening questions prior to the visit, additional usage of staff PPE, and extensive cleaning of exam room while observing appropriate contact time as indicated for disinfecting solutions.

## 2021-02-09 NOTE — Patient Instructions (Signed)
Please return in 3 months to recheck BP and weight.   We will call you with information regarding your referral appointment. General surgeon to assess the cyst.  If you do not hear from [place] within the next 2 weeks, please let me know. It can take 1-2 weeks to get appointments set up with the specialists.   I'll follow up on the cologuard. If you have any questions or concerns, please don't hesitate to send me a message via MyChart or call the office at [phone]. Thank you for visiting with [place] today! It's our pleasure caring for you.

## 2021-02-16 ENCOUNTER — Encounter: Payer: Self-pay | Admitting: Family Medicine

## 2021-02-16 NOTE — Telephone Encounter (Signed)
Please follow up on cologuard and get back with patient.  And notify referral that they never got this referral done. Ok to cancel for now but ensure it was in the works. thanks

## 2021-03-28 ENCOUNTER — Other Ambulatory Visit: Payer: Self-pay | Admitting: Family Medicine

## 2021-03-30 DIAGNOSIS — Z1212 Encounter for screening for malignant neoplasm of rectum: Secondary | ICD-10-CM | POA: Diagnosis not present

## 2021-03-30 DIAGNOSIS — Z1211 Encounter for screening for malignant neoplasm of colon: Secondary | ICD-10-CM | POA: Diagnosis not present

## 2021-03-30 NOTE — Telephone Encounter (Signed)
Last refill: 10/17/20 #90, 3 -expired Last OV: 02/09/21 dx. Cyst

## 2021-04-02 ENCOUNTER — Other Ambulatory Visit: Payer: Self-pay | Admitting: Family Medicine

## 2021-04-02 ENCOUNTER — Encounter: Payer: Self-pay | Admitting: Family Medicine

## 2021-04-02 LAB — COLOGUARD
COLOGUARD: NEGATIVE
Cologuard: NEGATIVE

## 2021-04-02 LAB — EXTERNAL GENERIC LAB PROCEDURE: COLOGUARD: NEGATIVE

## 2021-04-03 ENCOUNTER — Other Ambulatory Visit: Payer: Self-pay

## 2021-04-03 ENCOUNTER — Other Ambulatory Visit: Payer: Self-pay | Admitting: Family Medicine

## 2021-04-03 MED ORDER — MINOCYCLINE HCL 100 MG PO CAPS: 100.0000 mg | ORAL_CAPSULE | Freq: Two times a day (BID) | ORAL | 0 refills | Status: DC

## 2021-04-03 MED ORDER — KETOCONAZOLE 2 % EX SHAM: MEDICATED_SHAMPOO | CUTANEOUS | 3 refills | Status: DC

## 2021-04-03 NOTE — Telephone Encounter (Signed)
OK to refill? Or f/u visit needed

## 2021-04-07 ENCOUNTER — Encounter: Payer: Self-pay | Admitting: Family Medicine

## 2021-04-08 ENCOUNTER — Other Ambulatory Visit: Payer: Self-pay

## 2021-04-08 MED ORDER — KETOCONAZOLE 2 % EX SHAM: MEDICATED_SHAMPOO | CUTANEOUS | 3 refills | Status: DC

## 2021-04-13 DIAGNOSIS — G4733 Obstructive sleep apnea (adult) (pediatric): Secondary | ICD-10-CM | POA: Diagnosis not present

## 2021-04-20 ENCOUNTER — Encounter: Payer: Self-pay | Admitting: Family Medicine

## 2021-04-20 ENCOUNTER — Other Ambulatory Visit: Payer: Self-pay

## 2021-04-20 DIAGNOSIS — H1032 Unspecified acute conjunctivitis, left eye: Secondary | ICD-10-CM | POA: Diagnosis not present

## 2021-04-20 MED ORDER — OLOPATADINE HCL 0.1 % OP SOLN: 1.0000 [drp] | Freq: Two times a day (BID) | OPHTHALMIC | 12 refills | Status: DC

## 2021-04-20 NOTE — Telephone Encounter (Signed)
Please order patanol 2 drops daily thanks

## 2021-04-21 ENCOUNTER — Encounter: Payer: Self-pay | Admitting: Family

## 2021-04-21 ENCOUNTER — Other Ambulatory Visit: Payer: Self-pay

## 2021-04-21 ENCOUNTER — Ambulatory Visit: Payer: BC Managed Care – PPO | Admitting: Family

## 2021-04-21 VITALS — BP 128/84 | HR 63 | Temp 98.4°F | Ht 67.0 in | Wt 241.8 lb

## 2021-04-21 DIAGNOSIS — H6123 Impacted cerumen, bilateral: Secondary | ICD-10-CM

## 2021-04-21 DIAGNOSIS — B9689 Other specified bacterial agents as the cause of diseases classified elsewhere: Secondary | ICD-10-CM

## 2021-04-21 DIAGNOSIS — H109 Unspecified conjunctivitis: Secondary | ICD-10-CM | POA: Diagnosis not present

## 2021-04-21 NOTE — Progress Notes (Signed)
Acute Office Visit  Subjective:    Patient ID: Shannon Ibarra, male    DOB: [birth year], 45 y.o.   MRN: [id number]  Chief Complaint  Patient presents with  . Conjunctivitis    Pt was treated in urgent care yesterday for the left eye, but now has itchiness and throbbing in the right eye. He also complains of draining as well.     HPI Patient is in today as a follow-up from the urgent care on yesterday.  Patient reports reporting to the urgent care with complaints of left eye itchiness irritation, drainage and redness.  Reports that he has a history of allergic rhinitis and have been working in the yard over the weekend when his eye became irritated.  Today, his eye is much better after taking a nap.  However, it appears that his right eye is beginning to be affected.  He has matting, crusting, and redness.  He started tobramycin eyedrops yesterday.  Denies any blurred or double vision.  Sensitivity to light has improved.  Past Medical History:  Diagnosis Date  . Acne   . Allergic rhinitis    Allergist in [place]  . Anxiety   . Fatty liver   . Hyperlipidemia 11/[id number]  . Irritable bowel syndrome with diarrhea 05/04/2018  . Nonulcer dyspepsia 01/10/2018  . Rosacea, acne 06/07/2014  . Vestibular nerve damage 01/10/2018   Acute neuritis, labyrinthitis 09/2017; [facility] eval.   . Vestibular neuritis 10/13/2017    Past Surgical History:  Procedure Laterality Date  . TONSILLECTOMY      Family History  Problem Relation Age of Onset  . Hypertension Mother   . Throat cancer Father   . Colon polyps Father   . Hypertension Brother     Social History   Socioeconomic History  . Marital status: Married    Spouse name: Not on file  . Number of children: 1  . Years of education: Not on file  . Highest education level: Not on file  Occupational History  . Occupation: Games developer: [employer]  Tobacco Use  . Smoking status: Former Smoker    Packs/day: 1.00    Years:  12.00    Pack years: 12.00    Types: Cigarettes    Quit date: 03/08/2007    Years since quitting: 14.1  . Smokeless tobacco: Never Used  . Tobacco comment: quit again in 2022  Vaping Use  . Vaping Use: Never used  Substance and Sexual Activity  . Alcohol use: Yes    Alcohol/week: 2.0 standard drinks    Types: 1 Glasses of wine, 1 Cans of beer per week  . Drug use: No  . Sexual activity: Yes    Partners: Female  Other Topics Concern  . Not on file  Social History Narrative   Originally from [place]   Social Determinants of Health   Financial Resource Strain: Not on file  Food Insecurity: Not on file  Transportation Needs: Not on file  Physical Activity: Not on file  Stress: Not on file  Social Connections: Not on file  Intimate Partner Violence: Not on file    Outpatient Medications Prior to Visit  Medication Sig Dispense Refill  . cetirizine (ZYRTEC) 10 MG tablet Take 10 mg by mouth daily.    . cholecalciferol (VITAMIN D3) 25 MCG (1000 UNIT) tablet Take 1,000 Units by mouth daily.    . clindamycin (CLEOCIN) 300 MG capsule Take 1 capsule (300 mg total) by mouth 3 (three) times  daily. 21 capsule 0  . clonazePAM (KLONOPIN) 0.5 MG tablet TAKE 1 TABLET DAILY 90 tablet 1  . esomeprazole (NEXIUM) 40 MG capsule Take 20 mg by mouth daily.    Marland Kitchen ketoconazole (NIZORAL) 2 % shampoo Apply topically 2 (two) times a week. As needed 120 mL 3  . minocycline (MINOCIN) 100 MG capsule Take 1 capsule (100 mg total) by mouth 2 (two) times daily. 14 capsule 0  . olopatadine (PATANOL) 0.1 % ophthalmic solution Place 1 drop into both eyes 2 (two) times daily. 5 mL 12  . Omega-3 Fatty Acids (FISH OIL) 1000 MG CAPS Take 1 capsule (1,000 mg total) by mouth 2 (two) times daily.  0  . PARoxetine (PAXIL) 20 MG tablet Take 1 tablet (20 mg total) by mouth daily. 90 tablet 3  . phentermine (ADIPEX-P) 37.5 MG tablet Take 1 tablet (37.5 mg total) by mouth daily before breakfast. 30 tablet 2  . simvastatin (ZOCOR)  10 MG tablet Take 1 tablet (10 mg total) by mouth at bedtime. 90 tablet 3  . valACYclovir (VALTREX) 1000 MG tablet Take 2 tablets (2,000 mg total) by mouth 2 (two) times daily. Once, as needed for cold sores 20 tablet 2   No facility-administered medications prior to visit.    Allergies  Allergen Reactions  . Nitroglycerin Other (See Comments)    Severe bradycardia  . Bupropion Other (See Comments)  . Dust Mite Extract Rash    Review of Systems  Constitutional: Negative.   HENT: Positive for ear discharge and ear pain.   Respiratory: Negative.   Cardiovascular: Negative.   Musculoskeletal: Negative.   Skin: Negative.   Allergic/Immunologic: Negative.   Neurological: Negative.   Psychiatric/Behavioral: Negative.        Objective:    Physical Exam Eyes:     General:        Right eye: Discharge present.        Left eye: Discharge present.    Pupils: Pupils are equal, round, and reactive to light.     Comments: Conjunctiva are red and injected  Cardiovascular:     Rate and Rhythm: Normal rate and regular rhythm.  Pulmonary:     Effort: Pulmonary effort is normal.     Breath sounds: Normal breath sounds.  Musculoskeletal:        General: Normal range of motion.     Cervical back: Normal range of motion and neck supple.  Skin:    General: Skin is warm and dry.  Neurological:     General: No focal deficit present.     Mental Status: He is alert and oriented to person, place, and time.  Psychiatric:        Mood and Affect: Mood normal.        Behavior: Behavior normal.     BP 128/84   Pulse 63   Temp 98.4 F (36.9 C) (Temporal)   Ht 5' 7"  (1.702 m)   Wt 241 lb 12.8 oz (109.7 kg)   SpO2 98%   BMI 37.87 kg/m  Wt Readings from Last 3 Encounters:  04/21/21 241 lb 12.8 oz (109.7 kg)  02/09/21 259 lb 6.4 oz (117.7 kg)  01/30/21 265 lb 6.4 oz (120.4 kg)    Health Maintenance Due  Topic Date Due  . COLONOSCOPY (Pts 45-39yr Insurance coverage will need to be  confirmed)  Never done    There are no preventive care reminders to display for this patient.   Lab Results  Component Value  Date   TSH 1.52 09/15/2020   Lab Results  Component Value Date   WBC 5.4 09/15/2020   HGB 14.1 09/15/2020   HCT 41.4 09/15/2020   MCV 94.5 09/15/2020   PLT 213 09/15/2020   Lab Results  Component Value Date   NA 136 09/15/2020   K 4.4 09/15/2020   CO2 30 09/15/2020   GLUCOSE 83 09/15/2020   BUN 19 09/15/2020   CREATININE 1.03 09/15/2020   BILITOT 0.7 09/15/2020   ALKPHOS 54 11/20/2019   AST 37 09/15/2020   ALT 52 (H) 09/15/2020   PROT 7.0 09/15/2020   ALBUMIN 4.3 11/20/2019   CALCIUM 9.4 09/15/2020   GFR 92.49 11/20/2019   Lab Results  Component Value Date   CHOL 168 09/15/2020   Lab Results  Component Value Date   HDL 43 09/15/2020   Lab Results  Component Value Date   LDLCALC 92 09/15/2020   Lab Results  Component Value Date   TRIG 250 (H) 09/15/2020   Lab Results  Component Value Date   CHOLHDL 3.9 09/15/2020   No results found for: HGBA1C     Assessment & Plan:   Problem List Items Addressed This Visit   None   Visit Diagnoses    Bacterial conjunctivitis of both eyes    -  Primary   Bilateral impacted cerumen         Tobramycin eyedrops to both eyes.  Instructions given on cerumen impaction.  Call the office with any questions or concerns.  No orders of the defined types were placed in this encounter.    Kennyth Arnold, FNP

## 2021-04-21 NOTE — Patient Instructions (Signed)
Bacterial Conjunctivitis, Adult Bacterial conjunctivitis is an infection of your conjunctiva. This is the clear membrane that covers the white part of your eye and the inner part of your eyelid. This infection can make your eye:  Red or pink.  Itchy. This condition spreads easily from person to person (is contagious) and from one eye to the other eye. What are the causes?  This condition is caused by germs (bacteria). You may get the infection if you come into close contact with: ? A person who has the infection. ? Items that have germs on them (are contaminated), such as face towels, contact lens solution, or eye makeup. What increases the risk? You are more likely to get this condition if you:  Have contact with people who have the infection.  Wear contact lenses.  Have a sinus infection.  Have had a recent eye injury or surgery.  Have a weak body defense system (immune system).  Have dry eyes. What are the signs or symptoms?  Thick, yellowish discharge from the eye.  Tearing or watery eyes.  Itchy eyes.  Burning feeling in your eyes.  Eye redness.  Swollen eyelids.  Blurred vision.   How is this treated?  Antibiotic eye drops or ointment.  Antibiotic medicine taken by mouth. This is used for infections that do not get better with drops or ointment or that last more than 10 days.  Cool, wet cloths placed on the eyes.  Artificial tears used 2-6 times a day.   Follow these instructions at home: Medicines  Take or apply your antibiotic medicine as told by your doctor. Do not stop taking or applying the antibiotic even if you start to feel better.  Take or apply over-the-counter and prescription medicines only as told by your doctor.  Do not touch your eyelid with the eye-drop bottle or the ointment tube. Managing discomfort  Wipe any fluid from your eye with a warm, wet washcloth or a cotton ball.  Place a clean, cool, wet cloth on your eye. Do this for  10-20 minutes, 3-4 times per day. General instructions  Do not wear contacts until the infection is gone. Wear glasses until your doctor says it is okay to wear contacts again.  Do not wear eye makeup until the infection is gone. Throw away old eye makeup.  Change or wash your pillowcase every day.  Do not share towels or washcloths.  Wash your hands often with soap and water. Use paper towels to dry your hands.  Do not touch or rub your eyes.  Do not drive or use heavy machinery if your vision is blurred. Contact a doctor if:  You have a fever.  You do not get better after 10 days. Get help right away if:  You have a fever and your symptoms get worse all of a sudden.  You have very bad pain when you move your eye.  Your face: ? Hurts. ? Is red. ? Is swollen.  You have sudden loss of vision. Summary  Bacterial conjunctivitis is an infection of your conjunctiva.  This infection spreads easily from person to person.  Wash your hands often with soap and water. Use paper towels to dry your hands.  Take or apply your antibiotic medicine as told by your doctor.  Contact a doctor if you have a fever or you do not get better after 10 days. This information is not intended to replace advice given to you by your health care provider. Make sure you  discuss any questions you have with your health care provider. Document Revised: 11/04/2020 Document Reviewed: 07/19/2018 Elsevier Patient Education  2021 [place], Adult The ears produce a substance called earwax that helps keep bacteria out of the ear and protects the skin in the ear canal. Occasionally, earwax can build up in the ear and cause discomfort or hearing loss. What are the causes? This condition is caused by a buildup of earwax. Ear canals are self-cleaning. Ear wax is made in the outer part of the ear canal and generally falls out in small amounts over time. When the self-cleaning mechanism  is not working, earwax builds up and can cause decreased hearing and discomfort. Attempting to clean ears with cotton swabs can push the earwax deep into the ear canal and cause decreased hearing and pain. What increases the risk? This condition is more likely to develop in people who:  Clean their ears often with cotton swabs.  Pick at their ears.  Use earplugs or in-ear headphones often, or wear hearing aids. The following factors may also make you more likely to develop this condition:  Being male.  Being of older age.  Naturally producing more earwax.  Having narrow ear canals.  Having earwax that is overly thick or sticky.  Having excess hair in the ear canal.  Having eczema.  Being dehydrated. What are the signs or symptoms? Symptoms of this condition include:  Reduced or muffled hearing.  A feeling of fullness in the ear or feeling that the ear is plugged.  Fluid coming from the ear.  Ear pain or an itchy ear.  Ringing in the ear.  Coughing.  Balance problems.  An obvious piece of earwax that can be seen inside the ear canal. How is this diagnosed? This condition may be diagnosed based on:  Your symptoms.  Your medical history.  An ear exam. During the exam, your health care provider will look into your ear with an instrument called an otoscope. You may have tests, including a hearing test. How is this treated? This condition may be treated by:  Using ear drops to soften the earwax.  Having the earwax removed by a health care provider. The health care provider may: ? Flush the ear with water. ? Use an instrument that has a loop on the end (curette). ? Use a suction device.  Having surgery to remove the wax buildup. This may be done in severe cases. Follow these instructions at home:  Take over-the-counter and prescription medicines only as told by your health care provider.  Do not put any objects, including cotton swabs, into your ear. You  can clean the opening of your ear canal with a washcloth or facial tissue.  Follow instructions from your health care provider about cleaning your ears. Do not overclean your ears.  Drink enough fluid to keep your urine pale yellow. This will help to thin the earwax.  Keep all follow-up visits as told. If earwax builds up in your ears often or if you use hearing aids, consider seeing your health care provider for routine, preventive ear cleanings. Ask your health care provider how often you should schedule your cleanings.  If you have hearing aids, clean them according to instructions from the manufacturer and your health care provider.   Contact a health care provider if:  You have ear pain.  You develop a fever.  You have pus or other fluid coming from your ear.  You have hearing  loss.  You have ringing in your ears that does not go away.  You feel like the room is spinning (vertigo).  Your symptoms do not improve with treatment. Get help right away if:  You have bleeding from the affected ear.  You have severe ear pain. Summary  Earwax can build up in the ear and cause discomfort or hearing loss.  The most common symptoms of this condition include reduced or muffled hearing, a feeling of fullness in the ear, or feeling that the ear is plugged.  This condition may be diagnosed based on your symptoms, your medical history, and an ear exam.  This condition may be treated by using ear drops to soften the earwax or by having the earwax removed by a health care provider.  Do not put any objects, including cotton swabs, into your ear. You can clean the opening of your ear canal with a washcloth or facial tissue. This information is not intended to replace advice given to you by your health care provider. Make sure you discuss any questions you have with your health care provider. Document Revised: 04/01/2020 Document Reviewed: 04/01/2020 Elsevier Patient Education  [employer].

## 2021-05-10 ENCOUNTER — Encounter: Payer: Self-pay | Admitting: Family Medicine

## 2021-05-12 ENCOUNTER — Ambulatory Visit: Payer: BC Managed Care – PPO | Admitting: Family Medicine

## 2021-05-18 ENCOUNTER — Telehealth: Payer: Self-pay

## 2021-05-18 NOTE — Telephone Encounter (Signed)
Can we waive this pts no show fee from 05/12/2021? Pt missed his appointment due to his mother passing away. He did send a message through [place] on 05/10/2021 making Dr. Jonni Sanger aware that he was going to miss the appointment due to being [place].

## 2021-05-19 NOTE — Telephone Encounter (Signed)
I sent a message to charge correction to see if there is a way we can reverse the no show or prevent from the pt getting charged.

## 2021-05-19 NOTE — Telephone Encounter (Signed)
His message was not sent to me!  Yes, please call him and apologize.  Offer condolences.  And please get him rescheduled for his physical.  I did let Junious Silk know to make sure she cancels appointments when she gets these messages and forward them to me so I will be aware.  Thanks

## 2021-05-29 DIAGNOSIS — J3089 Other allergic rhinitis: Secondary | ICD-10-CM | POA: Diagnosis not present

## 2021-05-29 DIAGNOSIS — G4733 Obstructive sleep apnea (adult) (pediatric): Secondary | ICD-10-CM | POA: Diagnosis not present

## 2021-05-29 DIAGNOSIS — G4710 Hypersomnia, unspecified: Secondary | ICD-10-CM | POA: Diagnosis not present

## 2021-05-29 DIAGNOSIS — E669 Obesity, unspecified: Secondary | ICD-10-CM | POA: Diagnosis not present

## 2021-06-22 ENCOUNTER — Ambulatory Visit: Payer: BC Managed Care – PPO | Admitting: Family Medicine

## 2021-06-22 ENCOUNTER — Encounter: Payer: Self-pay | Admitting: Family Medicine

## 2021-06-22 ENCOUNTER — Other Ambulatory Visit: Payer: Self-pay

## 2021-06-22 VITALS — BP 128/80 | HR 67 | Temp 98.5°F | Ht 67.0 in | Wt 250.2 lb

## 2021-06-22 DIAGNOSIS — E230 Hypopituitarism: Secondary | ICD-10-CM | POA: Diagnosis not present

## 2021-06-22 DIAGNOSIS — G4733 Obstructive sleep apnea (adult) (pediatric): Secondary | ICD-10-CM | POA: Diagnosis not present

## 2021-06-22 DIAGNOSIS — F411 Generalized anxiety disorder: Secondary | ICD-10-CM

## 2021-06-22 NOTE — Progress Notes (Signed)
Subjective  CC:  Chief Complaint  Patient presents with   Weight Gain    Up 8 lbs since last visit     HPI: Shannon Ibarra is a 45 y.o. male who presents to the office today to address the problems listed above in the chief complaint. Obesity: Follow-up for weight loss medication.  We started phentermine 3 months ago.  He took it for about 3 weeks.  Had side effects of problems with sleeping and irritability.  Since stopping he feels back to his normal self.  He sought help from [facility] sky MD who is now treating his hypogonadotrophic hypogonadism with subcu testosterone replacement therapy.  He feels energetic again, his mood is better.  He is more active and exercising again.  No longer feeling depressed.  He understands risks and benefits.  He is does not carry high cardiovascular risk.  He has a clean cardiac cath back in 2018.  He is not diabetic.  He does not have hypertension.  He does have hypercholesterolemia controlled with a statin.  Libido is back, erections have normalized. Sleep apnea: Doing well with CPAP.  Understands weight loss will also help.  Weight is up 8 pounds because he just returned from the beach vacation.  He will get back to eating better.  His weight had decreased prior to that occasion. Anxiety disorder: Continues on Paxil.  Now feels back to his normal self.  No longer feeling unmotivated or depressed.  Assessment  1. Hypogonadotropic hypogonadism in male [facility])   2. Morbid obesity ([place])   3. Obstructive sleep apnea syndrome, severe   4. GAD (generalized anxiety disorder)      Plan  Continue testosterone replacement through to [facility].  Understands risk versus benefits.  Quality of life is much improved on testosterone replacement.  He has blood work follow-up with them in 3 months. Obesity: We will work on weight loss and exercise.  He has been successful in the past.  Stopped phentermine. OSA: Continue CPAP Anxiety disorder and mood disorder: Stable on  Paxil  Follow up: For complete physical 10/20/2021  No orders of the defined types were placed in this encounter.  No orders of the defined types were placed in this encounter.     I reviewed the patients updated PMH, FH, and SocHx.    Patient Active Problem List   Diagnosis Date Noted   GAD (generalized anxiety disorder) 01/10/2018    Priority: High   Vestibular nerve damage 01/10/2018    Priority: High   Obstructive sleep apnea syndrome, severe 10/19/2016    Priority: High   Mixed hyperlipidemia 05/10/2008    Priority: High   Irritable bowel syndrome with diarrhea 05/04/2018    Priority: Medium   History of fatty infiltration of liver 01/10/2018    Priority: Medium   Nonulcer dyspepsia 01/10/2018    Priority: Medium   Morbid obesity ([place]) 10/21/2014    Priority: Medium   Hypogonadotropic hypogonadism in male [facility]) 02/19/2014    Priority: Medium   Acne rosacea 06/07/2014    Priority: Low   Drug-induced gynecomastia 02/19/2014    Priority: Low   Allergic rhinitis 05/10/2008    Priority: Low   Non-cardiac chest pain 01/10/2018   Current Meds  Medication Sig   cetirizine (ZYRTEC) 10 MG tablet Take 10 mg by mouth daily.   cholecalciferol (VITAMIN D3) 25 MCG (1000 UNIT) tablet Take 1,000 Units by mouth daily.   clonazePAM (KLONOPIN) 0.5 MG tablet TAKE 1 TABLET DAILY  esomeprazole (NEXIUM) 40 MG capsule Take 20 mg by mouth daily.   ketoconazole (NIZORAL) 2 % shampoo Apply topically 2 (two) times a week. As needed   Omega-3 Fatty Acids (FISH OIL) 1000 MG CAPS Take 1 capsule (1,000 mg total) by mouth 2 (two) times daily.   PARoxetine (PAXIL) 20 MG tablet Take 1 tablet (20 mg total) by mouth daily.   simvastatin (ZOCOR) 10 MG tablet Take 1 tablet (10 mg total) by mouth at bedtime.   valACYclovir (VALTREX) 1000 MG tablet Take 2 tablets (2,000 mg total) by mouth 2 (two) times daily. Once, as needed for cold sores    Allergies: Patient is allergic to nitroglycerin,  bupropion, and dust mite extract. Family History: Patient family history includes Colon polyps in his father; Hypertension in his brother and mother; Throat cancer in his father. Social History:  Patient  reports that he quit smoking about 14 years ago. His smoking use included cigarettes. He has a 12.00 pack-year smoking history. He has never used smokeless tobacco. He reports current alcohol use of about 2.0 standard drinks of alcohol per week. He reports that he does not use drugs.  Review of Systems: Constitutional: Negative for fever malaise or anorexia Cardiovascular: negative for chest pain Respiratory: negative for SOB or persistent cough Gastrointestinal: negative for abdominal pain  Objective  Vitals: BP 128/80   Pulse 67   Temp 98.5 F (36.9 C) (Temporal)   Ht 5' 7"  (1.702 m)   Wt 250 lb 3.2 oz (113.5 kg)   SpO2 98%   BMI 39.19 kg/m  General: no acute distress , A&Ox3 Psychiatry: Appears happy.  Normal speech.    Commons side effects, risks, benefits, and alternatives for medications and treatment plan prescribed today were discussed, and the patient expressed understanding of the given instructions. Patient is instructed to call or message via MyChart if he/she has any questions or concerns regarding our treatment plan. No barriers to understanding were identified. We discussed Red Flag symptoms and signs in detail. Patient expressed understanding regarding what to do in case of urgent or emergency type symptoms.  Medication list was reconciled, printed and provided to the patient in AVS. Patient instructions and summary information was reviewed with the patient as documented in the AVS. This note was prepared with assistance of Dragon voice recognition software. Occasional wrong-word or sound-a-like substitutions may have occurred due to the inherent limitations of voice recognition software  This visit occurred during the SARS-CoV-2 public health emergency.  Safety  protocols were in place, including screening questions prior to the visit, additional usage of staff PPE, and extensive cleaning of exam room while observing appropriate contact time as indicated for disinfecting solutions.

## 2021-06-30 ENCOUNTER — Other Ambulatory Visit: Payer: Self-pay

## 2021-06-30 ENCOUNTER — Telehealth (INDEPENDENT_AMBULATORY_CARE_PROVIDER_SITE_OTHER): Payer: BC Managed Care – PPO | Admitting: Family

## 2021-06-30 ENCOUNTER — Encounter: Payer: Self-pay | Admitting: Family

## 2021-06-30 DIAGNOSIS — R059 Cough, unspecified: Secondary | ICD-10-CM

## 2021-06-30 DIAGNOSIS — J209 Acute bronchitis, unspecified: Secondary | ICD-10-CM | POA: Diagnosis not present

## 2021-06-30 MED ORDER — PREDNISONE 20 MG PO TABS: 40.0000 mg | ORAL_TABLET | Freq: Every day | ORAL | 0 refills | Status: DC

## 2021-06-30 MED ORDER — PROMETHAZINE-DM 6.25-15 MG/5ML PO SYRP: 5.0000 mL | ORAL_SOLUTION | Freq: Four times a day (QID) | ORAL | 0 refills | Status: DC | PRN

## 2021-06-30 NOTE — Progress Notes (Signed)
Virtual Visit via Video   I connected with patient on 06/30/21 at 11:15 AM EDT by a video enabled telemedicine application and verified that I am speaking with the correct person using two identifiers.  Location patient: Home Location provider: [place] participating in the virtual visit: Patient, Provider, CMA Reymundo Poll  I discussed the limitations of evaluation and management by telemedicine and the availability of in person appointments. The patient expressed understanding and agreed to proceed.  Subjective:   HPI:  45 year old white male presents via video visit today with complaints of cough, sore throat, congestion into his chest x2 to 3 days.  Denies any fever or chills.  Has been taking ibuprofen without much relief.  Had a COVID test that was negative.  Reports taking a walk around the neighborhood that seems to help his symptoms some.  Wife home sick with similar illness.  ROS:   See pertinent positives and negatives per HPI.  Patient Active Problem List   Diagnosis Date Noted   Irritable bowel syndrome with diarrhea 05/04/2018   GAD (generalized anxiety disorder) 01/10/2018   History of fatty infiltration of liver 01/10/2018   Non-cardiac chest pain 01/10/2018   Vestibular nerve damage 01/10/2018   Nonulcer dyspepsia 01/10/2018   Obstructive sleep apnea syndrome, severe 10/19/2016   Morbid obesity ([place]) 10/21/2014   Acne rosacea 06/07/2014   Drug-induced gynecomastia 02/19/2014   Hypogonadotropic hypogonadism in male [facility]) 02/19/2014   Mixed hyperlipidemia 05/10/2008   Allergic rhinitis 05/10/2008    Social History   Tobacco Use   Smoking status: Former    Packs/day: 1.00    Years: 12.00    Pack years: 12.00    Types: Cigarettes    Quit date: 03/08/2007    Years since quitting: 14.3   Smokeless tobacco: Never   Tobacco comments:    quit again in 2022  Substance Use Topics   Alcohol use: Yes    Alcohol/week: 2.0 standard  drinks    Types: 1 Glasses of wine, 1 Cans of beer per week    Current Outpatient Medications:    cetirizine (ZYRTEC) 10 MG tablet, Take 10 mg by mouth daily., Disp: , Rfl:    cholecalciferol (VITAMIN D3) 25 MCG (1000 UNIT) tablet, Take 1,000 Units by mouth daily., Disp: , Rfl:    clindamycin (CLEOCIN) 300 MG capsule, Take 1 capsule (300 mg total) by mouth 3 (three) times daily., Disp: 21 capsule, Rfl: 0   clonazePAM (KLONOPIN) 0.5 MG tablet, TAKE 1 TABLET DAILY, Disp: 90 tablet, Rfl: 1   esomeprazole (NEXIUM) 40 MG capsule, Take 20 mg by mouth daily., Disp: , Rfl:    ketoconazole (NIZORAL) 2 % shampoo, Apply topically 2 (two) times a week. As needed, Disp: 120 mL, Rfl: 3   minocycline (MINOCIN) 100 MG capsule, Take 1 capsule (100 mg total) by mouth 2 (two) times daily., Disp: 14 capsule, Rfl: 0   olopatadine (PATANOL) 0.1 % ophthalmic solution, Place 1 drop into both eyes 2 (two) times daily., Disp: 5 mL, Rfl: 12   Omega-3 Fatty Acids (FISH OIL) 1000 MG CAPS, Take 1 capsule (1,000 mg total) by mouth 2 (two) times daily., Disp: , Rfl: 0   PARoxetine (PAXIL) 20 MG tablet, Take 1 tablet (20 mg total) by mouth daily., Disp: 90 tablet, Rfl: 3   predniSONE (DELTASONE) 20 MG tablet, Take 2 tablets (40 mg total) by mouth daily with breakfast., Disp: 10 tablet, Rfl: 0   promethazine-dextromethorphan (PROMETHAZINE-DM) 6.25-15 MG/5ML  syrup, Take 5 mLs by mouth 4 (four) times daily as needed for cough., Disp: 118 mL, Rfl: 0   simvastatin (ZOCOR) 10 MG tablet, Take 1 tablet (10 mg total) by mouth at bedtime., Disp: 90 tablet, Rfl: 3   valACYclovir (VALTREX) 1000 MG tablet, Take 2 tablets (2,000 mg total) by mouth 2 (two) times daily. Once, as needed for cold sores, Disp: 20 tablet, Rfl: 2  Allergies  Allergen Reactions   Nitroglycerin Other (See Comments)    Severe bradycardia   Bupropion Other (See Comments)   Dust Mite Extract Rash    Objective:   There were no vitals taken for this  visit.  Patient is well-developed, well-nourished in no acute distress.  Resting comfortably at home.  Head is normocephalic, atraumatic.  No labored breathing.  Speech is clear and coherent with logical content.  Patient is alert and oriented at baseline.    Assessment and Plan:   Braxley was seen today for cough.  Diagnoses and all orders for this visit:  Cough  Acute bronchitis, unspecified organism  Other orders -     promethazine-dextromethorphan (PROMETHAZINE-DM) 6.25-15 MG/5ML syrup; Take 5 mLs by mouth 4 (four) times daily as needed for cough. -     predniSONE (DELTASONE) 20 MG tablet; Take 2 tablets (40 mg total) by mouth daily with breakfast.   Call the office with any questions or concern. Recheck as scheduled and as needed.   Kennyth Arnold, FNP 06/30/2021

## 2021-07-28 DIAGNOSIS — G4733 Obstructive sleep apnea (adult) (pediatric): Secondary | ICD-10-CM | POA: Diagnosis not present

## 2021-08-04 DIAGNOSIS — Z79890 Hormone replacement therapy (postmenopausal): Secondary | ICD-10-CM | POA: Diagnosis not present

## 2021-08-04 DIAGNOSIS — E291 Testicular hypofunction: Secondary | ICD-10-CM | POA: Diagnosis not present

## 2021-08-11 DIAGNOSIS — G4730 Sleep apnea, unspecified: Secondary | ICD-10-CM | POA: Diagnosis not present

## 2021-08-11 DIAGNOSIS — R768 Other specified abnormal immunological findings in serum: Secondary | ICD-10-CM | POA: Diagnosis not present

## 2021-08-11 DIAGNOSIS — F419 Anxiety disorder, unspecified: Secondary | ICD-10-CM | POA: Diagnosis not present

## 2021-08-11 DIAGNOSIS — E291 Testicular hypofunction: Secondary | ICD-10-CM | POA: Diagnosis not present

## 2021-08-18 ENCOUNTER — Encounter: Payer: Self-pay | Admitting: Family Medicine

## 2021-08-24 ENCOUNTER — Other Ambulatory Visit: Payer: Self-pay | Admitting: Family Medicine

## 2021-09-01 ENCOUNTER — Telehealth: Payer: Self-pay

## 2021-09-01 DIAGNOSIS — E782 Mixed hyperlipidemia: Secondary | ICD-10-CM

## 2021-09-01 DIAGNOSIS — K760 Fatty (change of) liver, not elsewhere classified: Secondary | ICD-10-CM

## 2021-09-01 DIAGNOSIS — R5383 Other fatigue: Secondary | ICD-10-CM

## 2021-09-01 NOTE — Telephone Encounter (Signed)
Please call pt and schedule lab appt only. I have placed orders in Epic.

## 2021-09-01 NOTE — Telephone Encounter (Signed)
Patient is calling in stating he has a CPE scheduled for October in the afternoon and doesn't feel comfortable fasting that long. Wondering if labs can be placed that way he can come prior to the appointment.

## 2021-09-01 NOTE — Telephone Encounter (Signed)
Dr. Jonni Sanger, please see message and advise if okay to order labs prior to physical?

## 2021-09-01 NOTE — Telephone Encounter (Signed)
Patient is scheduled   

## 2021-09-10 ENCOUNTER — Other Ambulatory Visit: Payer: Self-pay | Admitting: Family Medicine

## 2021-09-10 MED ORDER — MINOCYCLINE HCL 100 MG PO CAPS: 100.0000 mg | ORAL_CAPSULE | Freq: Two times a day (BID) | ORAL | 0 refills | Status: DC

## 2021-09-16 DIAGNOSIS — G4733 Obstructive sleep apnea (adult) (pediatric): Secondary | ICD-10-CM | POA: Diagnosis not present

## 2021-09-16 DIAGNOSIS — G4710 Hypersomnia, unspecified: Secondary | ICD-10-CM | POA: Diagnosis not present

## 2021-09-17 ENCOUNTER — Other Ambulatory Visit: Payer: Self-pay

## 2021-09-17 ENCOUNTER — Encounter: Payer: Self-pay | Admitting: Family Medicine

## 2021-09-17 MED ORDER — CLONAZEPAM 0.5 MG PO TABS: 0.5000 mg | ORAL_TABLET | Freq: Every day | ORAL | 1 refills | Status: DC

## 2021-09-17 NOTE — Telephone Encounter (Signed)
Last OV 06/22/2021 dx Hypogonadotrophic hypogonadism Last Refill 03/30/2021

## 2021-10-05 DIAGNOSIS — Z79890 Hormone replacement therapy (postmenopausal): Secondary | ICD-10-CM | POA: Diagnosis not present

## 2021-10-05 DIAGNOSIS — E291 Testicular hypofunction: Secondary | ICD-10-CM | POA: Diagnosis not present

## 2021-10-07 DIAGNOSIS — N62 Hypertrophy of breast: Secondary | ICD-10-CM | POA: Diagnosis not present

## 2021-10-07 DIAGNOSIS — E291 Testicular hypofunction: Secondary | ICD-10-CM | POA: Diagnosis not present

## 2021-10-14 ENCOUNTER — Other Ambulatory Visit (INDEPENDENT_AMBULATORY_CARE_PROVIDER_SITE_OTHER): Payer: BC Managed Care – PPO

## 2021-10-14 ENCOUNTER — Other Ambulatory Visit: Payer: Self-pay

## 2021-10-14 DIAGNOSIS — E782 Mixed hyperlipidemia: Secondary | ICD-10-CM | POA: Diagnosis not present

## 2021-10-14 DIAGNOSIS — K760 Fatty (change of) liver, not elsewhere classified: Secondary | ICD-10-CM | POA: Diagnosis not present

## 2021-10-14 DIAGNOSIS — R5383 Other fatigue: Secondary | ICD-10-CM | POA: Diagnosis not present

## 2021-10-14 LAB — CBC WITH DIFFERENTIAL/PLATELET
Basophils Absolute: 0 10*3/uL (ref 0.0–0.1)
Basophils Relative: 0.5 % (ref 0.0–3.0)
Eosinophils Absolute: 0.1 10*3/uL (ref 0.0–0.7)
Eosinophils Relative: 2.4 % (ref 0.0–5.0)
HCT: 45.3 % (ref 39.0–52.0)
Hemoglobin: 15.3 g/dL (ref 13.0–17.0)
Lymphocytes Relative: 29 % (ref 12.0–46.0)
Lymphs Abs: 1.7 10*3/uL (ref 0.7–4.0)
MCHC: 33.8 g/dL (ref 30.0–36.0)
MCV: 93.8 fl (ref 78.0–100.0)
Monocytes Absolute: 0.6 10*3/uL (ref 0.1–1.0)
Monocytes Relative: 11.2 % (ref 3.0–12.0)
Neutro Abs: 3.3 10*3/uL (ref 1.4–7.7)
Neutrophils Relative %: 56.9 % (ref 43.0–77.0)
Platelets: 230 10*3/uL (ref 150.0–400.0)
RBC: 4.83 Mil/uL (ref 4.22–5.81)
RDW: 13.1 % (ref 11.5–15.5)
WBC: 5.8 10*3/uL (ref 4.0–10.5)

## 2021-10-14 LAB — COMPREHENSIVE METABOLIC PANEL WITH GFR
ALT: 82 U/L — ABNORMAL HIGH (ref 0–53)
AST: 39 U/L — ABNORMAL HIGH (ref 0–37)
Albumin: 4.8 g/dL (ref 3.5–5.2)
Alkaline Phosphatase: 46 U/L (ref 39–117)
BUN: 14 mg/dL (ref 6–23)
CO2: 28 meq/L (ref 19–32)
Calcium: 10.1 mg/dL (ref 8.4–10.5)
Chloride: 97 meq/L (ref 96–112)
Creatinine, Ser: 1.03 mg/dL (ref 0.40–1.50)
GFR: 86.96 mL/min
Glucose, Bld: 88 mg/dL (ref 70–99)
Potassium: 4.8 meq/L (ref 3.5–5.1)
Sodium: 134 meq/L — ABNORMAL LOW (ref 135–145)
Total Bilirubin: 0.5 mg/dL (ref 0.2–1.2)
Total Protein: 7.7 g/dL (ref 6.0–8.3)

## 2021-10-14 LAB — LIPID PANEL
Cholesterol: 175 mg/dL (ref 0–200)
HDL: 41.8 mg/dL (ref >39.00)
LDL Cholesterol: 105 mg/dL — ABNORMAL HIGH (ref 0–99)
NonHDL: 133.35
Total CHOL/HDL Ratio: 4
Triglycerides: 143 mg/dL (ref 0.0–149.0)
VLDL: 28.6 mg/dL (ref 0.0–40.0)

## 2021-10-14 LAB — TSH: TSH: 1.76 u[IU]/mL (ref 0.35–5.50)

## 2021-10-19 ENCOUNTER — Other Ambulatory Visit: Payer: Self-pay

## 2021-10-19 ENCOUNTER — Ambulatory Visit (INDEPENDENT_AMBULATORY_CARE_PROVIDER_SITE_OTHER): Payer: BC Managed Care – PPO | Admitting: Family Medicine

## 2021-10-19 ENCOUNTER — Encounter: Payer: Self-pay | Admitting: Family Medicine

## 2021-10-19 VITALS — BP 122/78 | HR 65 | Temp 98.7°F | Ht 67.0 in | Wt 256.4 lb

## 2021-10-19 DIAGNOSIS — E782 Mixed hyperlipidemia: Secondary | ICD-10-CM | POA: Diagnosis not present

## 2021-10-19 DIAGNOSIS — K760 Fatty (change of) liver, not elsewhere classified: Secondary | ICD-10-CM

## 2021-10-19 DIAGNOSIS — R7989 Other specified abnormal findings of blood chemistry: Secondary | ICD-10-CM | POA: Diagnosis not present

## 2021-10-19 DIAGNOSIS — Z0000 Encounter for general adult medical examination without abnormal findings: Secondary | ICD-10-CM

## 2021-10-19 DIAGNOSIS — E230 Hypopituitarism: Secondary | ICD-10-CM

## 2021-10-19 DIAGNOSIS — N62 Hypertrophy of breast: Secondary | ICD-10-CM

## 2021-10-19 DIAGNOSIS — T50905A Adverse effect of unspecified drugs, medicaments and biological substances, initial encounter: Secondary | ICD-10-CM

## 2021-10-19 DIAGNOSIS — F411 Generalized anxiety disorder: Secondary | ICD-10-CM | POA: Diagnosis not present

## 2021-10-19 DIAGNOSIS — G4733 Obstructive sleep apnea (adult) (pediatric): Secondary | ICD-10-CM

## 2021-10-19 LAB — SEDIMENTATION RATE: Sed Rate: 9 mm/hr (ref 0–15)

## 2021-10-19 MED ORDER — RALOXIFENE HCL 60 MG PO TABS: 60.0000 mg | ORAL_TABLET | Freq: Every day | ORAL | Status: DC

## 2021-10-19 MED ORDER — SIMVASTATIN 20 MG PO TABS: 20.0000 mg | ORAL_TABLET | Freq: Every day | ORAL | 3 refills | Status: DC

## 2021-10-19 MED ORDER — PAROXETINE HCL 20 MG PO TABS: 20.0000 mg | ORAL_TABLET | Freq: Every day | ORAL | 3 refills | Status: DC

## 2021-10-19 NOTE — Patient Instructions (Signed)
Please return in 12 months for your annual complete physical; please come fasting.   I will release your lab results to you on your MyChart account with further instructions. Please reply with any questions.    We will all you to schedule an ultrasound of your liver.   If you have any questions or concerns, please don't hesitate to send me a message via MyChart or call the office at [phone]. Thank you for visiting with [place] today! It's our pleasure caring for you.   Fatty Liver Disease The liver converts food into energy, removes toxic material from the blood, makes important proteins, and absorbs necessary vitamins from food. Fatty liver disease occurs when too much fat has built up in your liver cells. Fatty liver disease is also called hepatic steatosis. In many cases, fatty liver disease does not cause symptoms or problems. It is often diagnosed when tests are being done for other reasons. However, over time, fatty liver can cause inflammation that may lead to more serious liver problems, such as scarring of the liver (cirrhosis) and liver failure. Fatty liver is associated with insulin resistance, increased body fat, high blood pressure (hypertension), and high cholesterol. These are features of metabolic syndrome and increase your risk for stroke, diabetes, and heart disease. What are the causes? This condition may be caused by components of metabolic syndrome: Obesity. Insulin resistance. High cholesterol. Other causes: Alcohol abuse. Poor nutrition. Cushing syndrome. Pregnancy. Certain drugs. Poisons. Some viral infections. What increases the risk? You are more likely to develop this condition if you: Abuse alcohol. Are overweight. Have diabetes. Have hepatitis. Have a high triglyceride level. Are pregnant. What are the signs or symptoms? Fatty liver disease often does not cause symptoms. If symptoms do develop, they can include: Fatigue and weakness. Weight  loss. Confusion. Nausea, vomiting, or abdominal pain. Yellowing of your skin and the white parts of your eyes (jaundice). Itchy skin. How is this diagnosed? This condition may be diagnosed by: A physical exam and your medical history. Blood tests. Imaging tests, such as an ultrasound, CT scan, or MRI. A liver biopsy. A small sample of liver tissue is removed using a needle. The sample is then looked at under a microscope. How is this treated? Fatty liver disease is often caused by other health conditions. Treatment for fatty liver may involve medicines and lifestyle changes to manage conditions such as: Alcoholism. High cholesterol. Diabetes. Being overweight or obese. Follow these instructions at home:  Do not drink alcohol. If you have trouble quitting, ask your health care provider how to safely quit with the help of medicine or a supervised program. This is important to keep your condition from getting worse. Eat a healthy diet as told by your health care provider. Ask your health care provider about working with a dietitian to develop an eating plan. Exercise regularly. This can help you lose weight and control your cholesterol and diabetes. Talk to your health care provider about an exercise plan and which activities are best for you. Take over-the-counter and prescription medicines only as told by your health care provider. Keep all follow-up visits. This is important. Contact a health care provider if: You have trouble controlling your: Blood sugar. This is especially important if you have diabetes. Cholesterol. Drinking of alcohol. Get help right away if: You have abdominal pain. You have jaundice. You have nausea and are vomiting. You vomit blood or material that looks like coffee grounds. You have stools that are black, tar-like, or bloody.  Summary Fatty liver disease develops when too much fat builds up in the cells of your liver. Fatty liver disease often causes no  symptoms or problems. However, over time, fatty liver can cause inflammation that may lead to more serious liver problems, such as scarring of the liver (cirrhosis). You are more likely to develop this condition if you abuse alcohol, are pregnant, are overweight, have diabetes, have hepatitis, or have high triglyceride or cholesterol levels. Contact your health care provider if you have trouble controlling your blood sugar, cholesterol, or drinking of alcohol. This information is not intended to replace advice given to you by your health care provider. Make sure you discuss any questions you have with your health care provider. Document Revised: 09/25/2020 Document Reviewed: 09/25/2020 Elsevier Patient Education  Shannon Ibarra.

## 2021-10-19 NOTE — Progress Notes (Signed)
Subjective  Chief Complaint  Patient presents with   Annual Exam   Hyperlipidemia   Anxiety    HPI: Shannon Ibarra is a 45 y.o. male who presents to [place] [facility] today for a Male Wellness Visit. He also has the concerns and/or needs as listed above in the chief complaint. These will be addressed in addition to the Health Maintenance Visit.   Wellness Visit: annual visit with health maintenance review and exam   Health maintenance: Exercising regularly.  Eats mostly at home.  Immunizations up-to-date.  Had lab work a few days ago: See below for results.  Still seeing [facility] for hypogonadism.  Now on raloxifene for gynecomastia induced by testosterone.  Body mass index is 40.16 kg/m. Wt Readings from Last 3 Encounters:  10/19/21 256 lb 6.4 oz (116.3 kg)  06/22/21 250 lb 3.2 oz (113.5 kg)  04/21/21 241 lb 12.8 oz (109.7 kg)    Chronic disease management visit and/or acute problem visit: Anxiety disorder: Doing very well.  Remains on Paxil.  No adverse effects.  Mood is excellent. Fatty liver: Last ultrasound from 2008 reviewed.  Has had persistent elevations of LFTs.  He denies right upper quadrant pain, ascites, lower extremity edema. Acne rosacea on oral antibiotics stable Hyperlipidemia on simvastatin 10 mg daily.  Tries eat a low-fat diet.  Weight is up, in part from strength training.  BMI greater than 40 now.  No adverse effects  Patient Active Problem List   Diagnosis Date Noted   GAD (generalized anxiety disorder) 01/10/2018   Vestibular nerve damage 01/10/2018   Obstructive sleep apnea syndrome, severe 10/19/2016   Mixed hyperlipidemia 05/10/2008   Irritable bowel syndrome with diarrhea 05/04/2018   History of fatty infiltration of liver 01/10/2018   Nonulcer dyspepsia 01/10/2018   Morbid obesity ([place]) 10/21/2014   Hypogonadotropic hypogonadism in male [facility]) 02/19/2014   Acne rosacea 06/07/2014   Drug-induced gynecomastia 02/19/2014    Allergic rhinitis 05/10/2008   Hepatic steatosis 10/19/2021   Non-cardiac chest pain 01/10/2018   Health Maintenance  Topic Date Due   TETANUS/TDAP  12/06/2023   INFLUENZA VACCINE  Completed   COVID-19 Vaccine  Completed   Hepatitis C Screening  Completed   HIV Screening  Completed   Pneumococcal Vaccine 41-19 Years old  Aged Out   HPV VACCINES  Aged Out   Immunization History  Administered Date(s) Administered   Influenza Inj Mdck Quad With Preservative 10/05/2021   Influenza Split 10/17/2015   Influenza, Quadrivalent, Recombinant, Inj, Pf 09/01/2018   Influenza,inj,Quad PF,6+ Mos 11/03/2017, 09/12/2020, 10/05/2021   PFIZER(Purple Top)SARS-COV-2 Vaccination 03/05/2020, 03/26/2020, 10/13/2020   Pfizer Covid-19 Vaccine Bivalent Booster 82yr & up 10/05/2021, 10/05/2021   Td 12/27/2002   Tdap 12/05/2013   We updated and reviewed the patient's past history in detail and it is documented below. Allergies: Patient is allergic to nitroglycerin, bupropion, and dust mite extract. Past Medical History  has a past medical history of Acne, Allergic rhinitis, Anxiety, Fatty liver, Hyperlipidemia (11/2282013), Irritable bowel syndrome with diarrhea (05/04/2018), Nonulcer dyspepsia (01/10/2018), Rosacea, acne (06/07/2014), Vestibular nerve damage (01/10/2018), and Vestibular neuritis (10/13/2017). Past Surgical History Patient  has a past surgical history that includes Tonsillectomy. Social History Patient  reports that he quit smoking about 14 years ago. His smoking use included cigarettes. He has a 12.00 pack-year smoking history. He has never used smokeless tobacco. He reports current alcohol use of about 2.0 standard drinks per week. He reports that he does  not use drugs. Family History family history includes Colon polyps in his father; Hypertension in his brother and mother; Throat cancer in his father. Review of Systems: Constitutional: negative for fever or malaise Ophthalmic: negative  for photophobia, double vision or loss of vision Cardiovascular: negative for chest pain, dyspnea on exertion, or new LE swelling Respiratory: negative for SOB or persistent cough Gastrointestinal: negative for abdominal pain, change in bowel habits or melena Genitourinary: negative for dysuria or gross hematuria Musculoskeletal: negative for new gait disturbance or muscular weakness Integumentary: negative for new or persistent rashes Neurological: negative for TIA or stroke symptoms Psychiatric: negative for SI or delusions Allergic/Immunologic: negative for hives  Patient Care Team    Relationship Specialty Notifications Start End  Leamon Arnt, MD PCP - General Family Medicine  01/10/18   Janie Morning, MD  Gastroenterology  01/10/18   Susette Racer, MD Consulting Physician Endocrinology  01/10/18   Dionne Milo, MD Consulting Physician Pulmonary Disease  01/10/18    Objective  Vitals: BP 122/78   Pulse 65   Temp 98.7 F (37.1 C)   Ht 5' 7"  (1.702 m)   Wt 256 lb 6.4 oz (116.3 kg)   SpO2 96%   BMI 40.16 kg/m  General:  Well developed, well nourished, no acute distress, increased muscle bulk Psych:  Alert and orientedx3,normal mood and affect HEENT:  Normocephalic, atraumatic, non-icteric sclera, PERRL, supple neck without adenopathy, mass or thyromegaly Cardiovascular:  Normal S1, S2, RRR without gallop, rub or murmur, nondisplaced PMI, +2 distal pulses in bilateral upper and lower extremities. Respiratory:  Good breath sounds bilaterally, CTAB with normal respiratory effort Gastrointestinal: normal bowel sounds, soft, non-tender, no noted masses. No HSM, no ascites MSK: no deformities, contusions. Joints are without erythema or swelling. Spine and CVA region are nontender Skin:  Warm, no rashes or suspicious lesions noted, no palmar erythema Neurologic:    Mental status is normal. CN 2-11 are normal. Gross motor and sensory exams are normal. Stable gait. No  tremor GU: No inguinal hernias or adenopathy are appreciated bilaterally   No visits with results within 1 Day(s) from this visit.  Latest known visit with results is:  Lab on 10/14/2021  Component Date Value Ref Range Status   TSH 10/14/2021 1.76  0.35 - 5.50 uIU/mL Final   Cholesterol 10/14/2021 175  0 - 200 mg/dL Final   Triglycerides 10/14/2021 143.0  0.0 - 149.0 mg/dL Final   HDL 10/14/2021 41.80  >39.00 mg/dL Final   VLDL 10/14/2021 28.6  0.0 - 40.0 mg/dL Final   LDL Cholesterol 10/14/2021 105 (A)  0 - 99 mg/dL Final   Total CHOL/HDL Ratio 10/14/2021 4   Final   NonHDL 10/14/2021 133.35   Final   Sodium 10/14/2021 134 (A)  135 - 145 mEq/L Final   Potassium 10/14/2021 4.8  3.5 - 5.1 mEq/L Final   Chloride 10/14/2021 97  96 - 112 mEq/L Final   CO2 10/14/2021 28  19 - 32 mEq/L Final   Glucose, Bld 10/14/2021 88  70 - 99 mg/dL Final   BUN 10/14/2021 14  6 - 23 mg/dL Final   Creatinine, Ser 10/14/2021 1.03  0.40 - 1.50 mg/dL Final   Total Bilirubin 10/14/2021 0.5  0.2 - 1.2 mg/dL Final   Alkaline Phosphatase 10/14/2021 46  39 - 117 U/L Final   AST 10/14/2021 39 (A)  0 - 37 U/L Final   ALT 10/14/2021 82 (A)  0 - 53 U/L Final   Total  Protein 10/14/2021 7.7  6.0 - 8.3 g/dL Final   Albumin 10/14/2021 4.8  3.5 - 5.2 g/dL Final   GFR 10/14/2021 86.96  >60.00 mL/min Final   Calcium 10/14/2021 10.1  8.4 - 10.5 mg/dL Final   WBC 10/14/2021 5.8  4.0 - 10.5 K/uL Final   RBC 10/14/2021 4.83  4.22 - 5.81 Mil/uL Final   Hemoglobin 10/14/2021 15.3  13.0 - 17.0 g/dL Final   HCT 10/14/2021 45.3  39.0 - 52.0 % Final   MCV 10/14/2021 93.8  78.0 - 100.0 fl Final   MCHC 10/14/2021 33.8  30.0 - 36.0 g/dL Final   RDW 10/14/2021 13.1  11.5 - 15.5 % Final   Platelets 10/14/2021 230.0  150.0 - 400.0 K/uL Final   Neutrophils Relative % 10/14/2021 56.9  43.0 - 77.0 % Final   Lymphocytes Relative 10/14/2021 29.0  12.0 - 46.0 % Final   Monocytes Relative 10/14/2021 11.2  3.0 - 12.0 % Final   Eosinophils  Relative 10/14/2021 2.4  0.0 - 5.0 % Final   Basophils Relative 10/14/2021 0.5  0.0 - 3.0 % Final   Neutro Abs 10/14/2021 3.3  1.4 - 7.7 K/uL Final   Lymphs Abs 10/14/2021 1.7  0.7 - 4.0 K/uL Final   Monocytes Absolute 10/14/2021 0.6  0.1 - 1.0 K/uL Final   Eosinophils Absolute 10/14/2021 0.1  0.0 - 0.7 K/uL Final   Basophils Absolute 10/14/2021 0.0  0.0 - 0.1 K/uL Final    Assessment  1. Annual physical exam   2. GAD (generalized anxiety disorder)   3. Obstructive sleep apnea syndrome, severe   4. Mixed hyperlipidemia   5. Hypogonadotropic hypogonadism in male [facility])   6. Hepatic steatosis   7. Elevated LFTs   8. Drug-induced gynecomastia      Plan  Male Wellness Visit: Age appropriate Health Maintenance and Prevention measures were discussed with patient. Included topics are cancer screening recommendations, ways to keep healthy (see AVS) including dietary and exercise recommendations, regular eye and dental care, use of seat belts, and avoidance of moderate alcohol use and tobacco use.  BMI: discussed patient's BMI and encouraged positive lifestyle modifications to help get to or maintain a target BMI. HM needs and immunizations were addressed and ordered. See below for orders. See HM and immunization section for updates. Routine labs and screening tests ordered including cmp, cbc and lipids where appropriate. Discussed recommendations regarding Vit D and calcium supplementation (see AVS)  Chronic disease f/u and/or acute problem visit: (deemed necessary to be done in addition to the wellness visit): Elevated liver FTs with hepatic steatosis: Check lab work to rule out no other secondary causes.  Recheck ALT liver ultrasound.  May warrant hepatology referral in the future.  Discussed low-fat diet.  Discussed risks of ongoing liver disease. Anxiety disorder: Continue Paxil 20 mg nightly.  Well-controlled Continue CPAP for sleep apnea Hyperlipidemia: Would like LDL less than 100,  increase simvastatin to 20 mg daily.  Monitor LFTs   Follow up: 12 months for complete physical [place] side effects, risks, benefits, and alternatives for medications and treatment plan prescribed today were discussed, and the patient expressed understanding of the given instructions. Patient is instructed to call or message via MyChart if he/she has any questions or concerns regarding our treatment plan. No barriers to understanding were identified. We discussed Red Flag symptoms and signs in detail. Patient expressed understanding regarding what to do in case of urgent or emergency type symptoms.  Medication list was reconciled, printed and  provided to the patient in AVS. Patient instructions and summary information was reviewed with the patient as documented in the AVS. This note was prepared with assistance of Dragon voice recognition software. Occasional wrong-word or sound-a-like substitutions may have occurred due to the inherent limitations of voice recognition software  This visit occurred during the SARS-CoV-2 public health emergency.  Safety protocols were in place, including screening questions prior to the visit, additional usage of staff PPE, and extensive cleaning of exam room while observing appropriate contact time as indicated for disinfecting solutions.   Orders Placed This Encounter  Procedures   US ABDOMEN LIMITED RUQ (LIVER/GB)   Mitochondrial antibodies   ANA   Anti-smooth muscle antibody, IgG   Hepatitis B surface antigen   Hepatitis B core antibody, total   Hepatitis B surface antibody,qualitative   Sedimentation rate   Hepatitis C antibody   Meds ordered this encounter  Medications   PARoxetine (PAXIL) 20 MG tablet    Sig: Take 1 tablet (20 mg total) by mouth daily.    Dispense:  90 tablet    Refill:  3   simvastatin (ZOCOR) 20 MG tablet    Sig: Take 1 tablet (20 mg total) by mouth at bedtime.    Dispense:  90 tablet    Refill:  3   raloxifene (EVISTA) 60 MG  tablet    Sig: Take 1 tablet (60 mg total) by mouth daily.

## 2021-10-20 ENCOUNTER — Telehealth: Payer: Self-pay

## 2021-10-20 ENCOUNTER — Encounter: Payer: BC Managed Care – PPO | Admitting: Family Medicine

## 2021-10-20 NOTE — Telephone Encounter (Signed)
Patient called regarding his lab results, he is concerned that his labs are abnormal. Let him know that they have not been reviewed just yet but I would send a message over. Patient gave a verbal understanding, but was very hesitant.

## 2021-10-21 LAB — ANA: Anti Nuclear Antibody (ANA): POSITIVE — AB

## 2021-10-21 LAB — HEPATITIS B SURFACE ANTIBODY,QUALITATIVE: Hep B S Ab: REACTIVE — AB

## 2021-10-21 LAB — ANTI-NUCLEAR AB-TITER (ANA TITER): ANA Titer 1: 1:40 {titer} — ABNORMAL HIGH

## 2021-10-21 LAB — MITOCHONDRIAL ANTIBODIES: Mitochondrial M2 Ab, IgG: <=20 U (ref <=20.0)

## 2021-10-21 LAB — HEPATITIS B SURFACE ANTIGEN: Hepatitis B Surface Ag: NONREACTIVE

## 2021-10-21 LAB — HEPATITIS B CORE ANTIBODY, TOTAL: Hep B Core Total Ab: NONREACTIVE

## 2021-10-21 LAB — ANTI-SMOOTH MUSCLE ANTIBODY, IGG: Actin (Smooth Muscle) Antibody (IGG): <20 U (ref <20)

## 2021-10-21 LAB — HEPATITIS C ANTIBODY
Hepatitis C Ab: NONREACTIVE
SIGNAL TO CUT-OFF: 0.03 (ref <1.00)

## 2021-10-21 NOTE — Telephone Encounter (Signed)
Spoke with patient regarding labs, gave a verbal understanding. States he was very concerned, reassured him labs are fine so far

## 2021-10-22 ENCOUNTER — Ambulatory Visit
Admission: RE | Admit: 2021-10-22 | Discharge: 2021-10-22 | Disposition: A | Discharge status: Home / Self Care | Payer: BC Managed Care – PPO | Source: Ambulatory Visit | Attending: Family Medicine | Admitting: Family Medicine

## 2021-10-22 DIAGNOSIS — R7989 Other specified abnormal findings of blood chemistry: Secondary | ICD-10-CM | POA: Diagnosis not present

## 2021-10-22 DIAGNOSIS — K760 Fatty (change of) liver, not elsewhere classified: Secondary | ICD-10-CM | POA: Diagnosis not present

## 2021-10-26 DIAGNOSIS — G4733 Obstructive sleep apnea (adult) (pediatric): Secondary | ICD-10-CM | POA: Diagnosis not present

## 2021-11-23 DIAGNOSIS — E291 Testicular hypofunction: Secondary | ICD-10-CM | POA: Diagnosis not present

## 2021-11-23 DIAGNOSIS — R635 Abnormal weight gain: Secondary | ICD-10-CM | POA: Diagnosis not present

## 2021-11-25 DIAGNOSIS — Z1339 Encounter for screening examination for other mental health and behavioral disorders: Secondary | ICD-10-CM | POA: Diagnosis not present

## 2021-11-25 DIAGNOSIS — Z1331 Encounter for screening for depression: Secondary | ICD-10-CM | POA: Diagnosis not present

## 2021-11-25 DIAGNOSIS — Z6839 Body mass index (BMI) 39.0-39.9, adult: Secondary | ICD-10-CM | POA: Diagnosis not present

## 2021-11-25 DIAGNOSIS — F419 Anxiety disorder, unspecified: Secondary | ICD-10-CM | POA: Diagnosis not present

## 2021-11-25 DIAGNOSIS — R768 Other specified abnormal immunological findings in serum: Secondary | ICD-10-CM | POA: Diagnosis not present

## 2021-12-03 DIAGNOSIS — Z6838 Body mass index (BMI) 38.0-38.9, adult: Secondary | ICD-10-CM | POA: Diagnosis not present

## 2021-12-03 DIAGNOSIS — R768 Other specified abnormal immunological findings in serum: Secondary | ICD-10-CM | POA: Diagnosis not present

## 2021-12-29 IMAGING — US US ABDOMEN LIMITED
1 series · 14 of 25 positions shown · non-contrast
Comparison: Remote abdominal ultrasound 05/10/2007

CLINICAL DATA: Elevated LFTs.

EXAM:
ULTRASOUND ABDOMEN LIMITED RIGHT UPPER QUADRANT

[Series 1: us abdomen limited · 0.18mm/px · 14 of 37 slices shown]
[im 1/37]
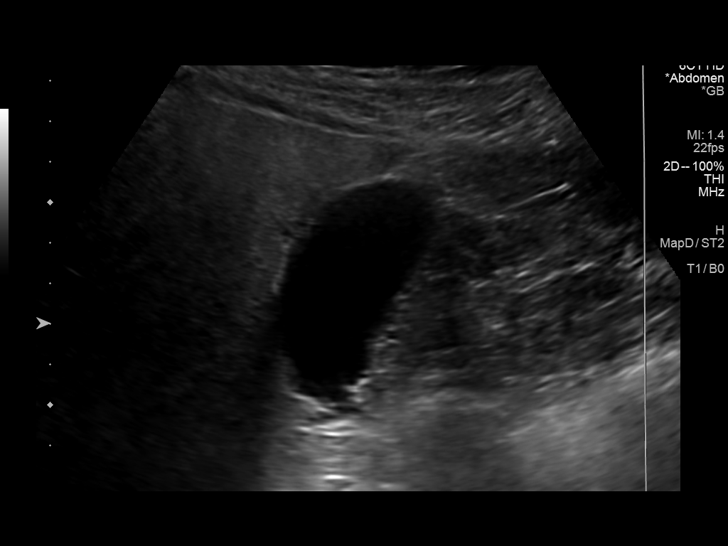
[im 4/37]
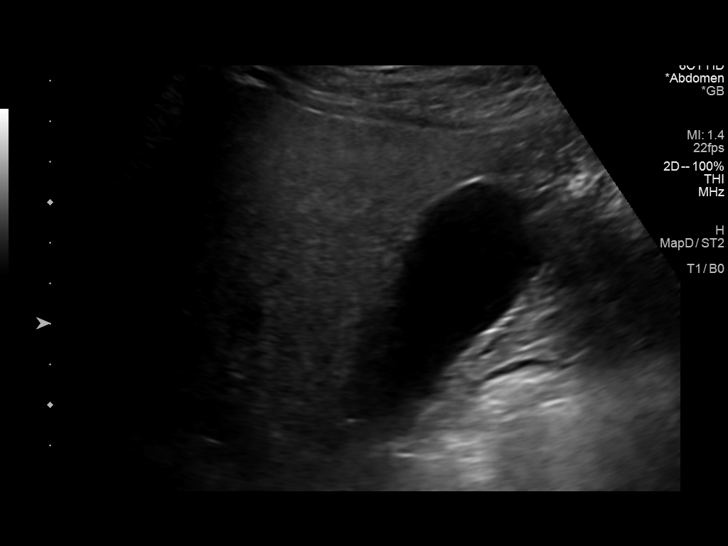
[im 7/37]
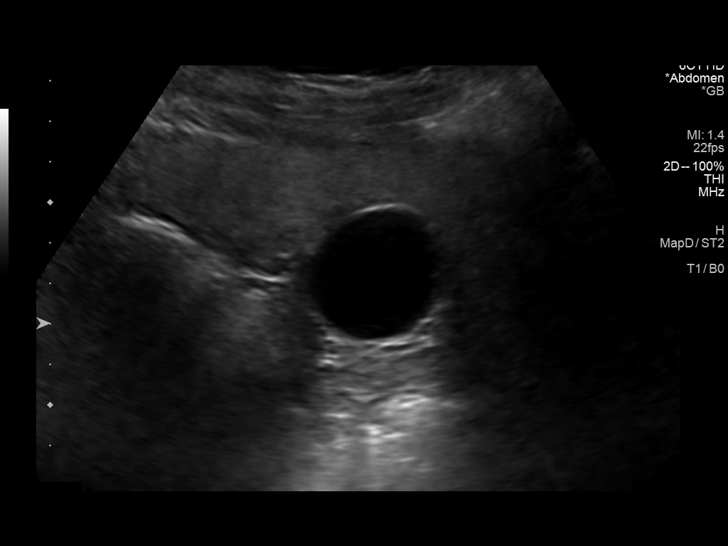
[im 10/37]
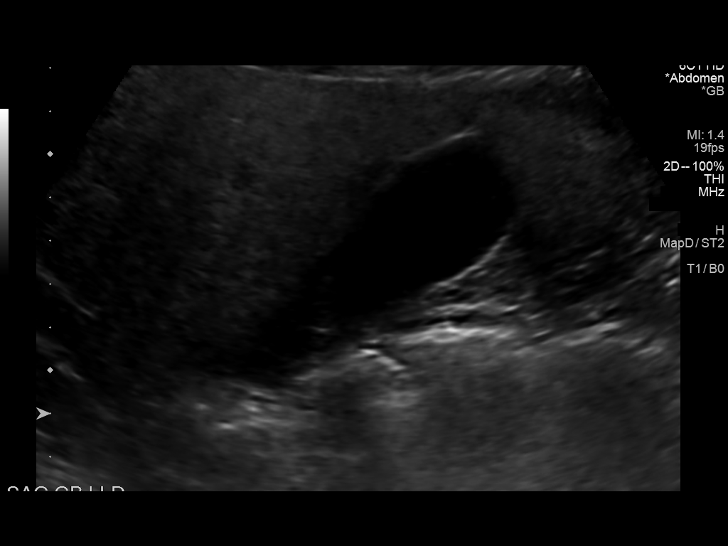
[im 13/37]
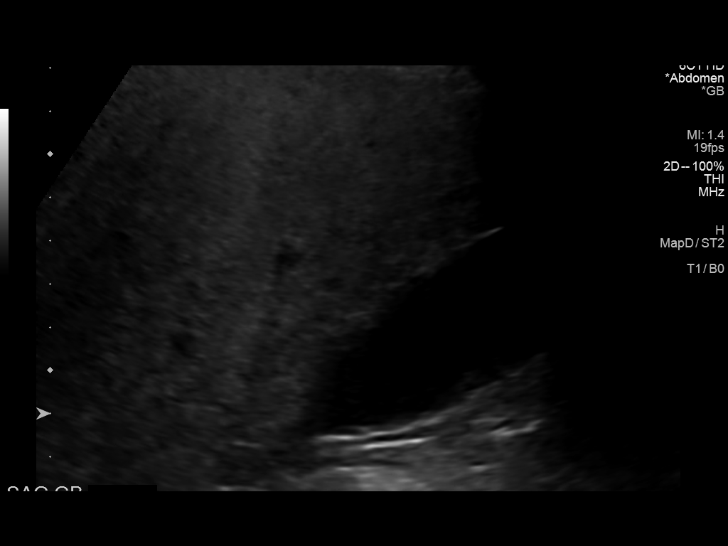
[im 14/37]
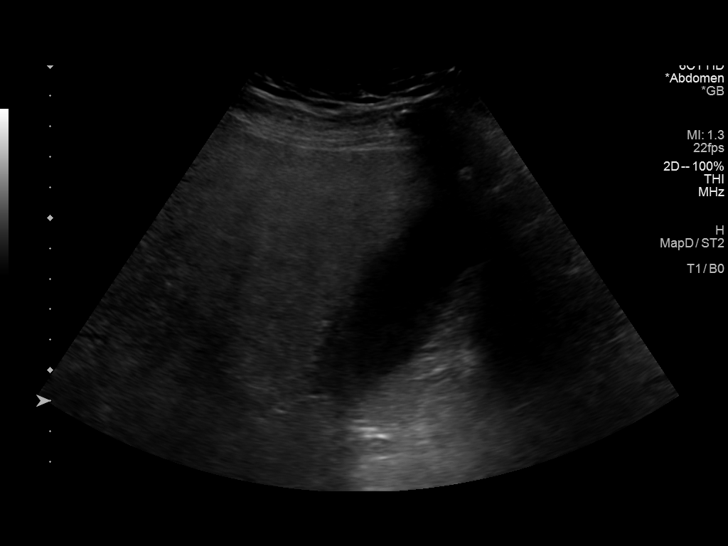
[im 17/37]
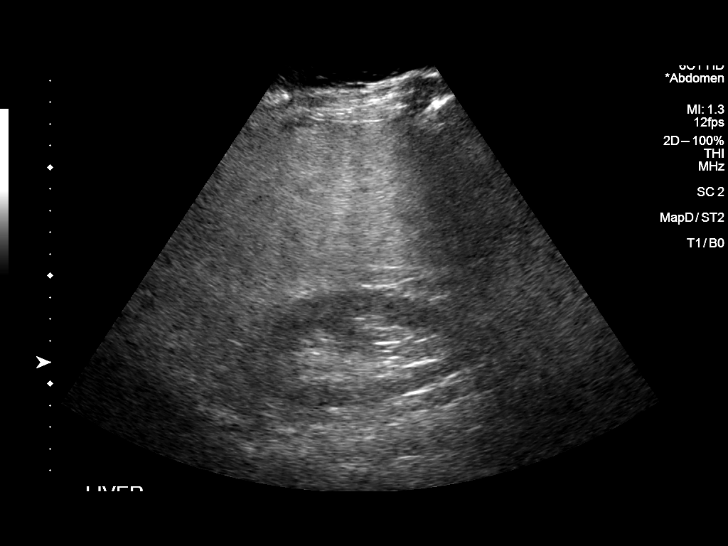
[im 20/37]
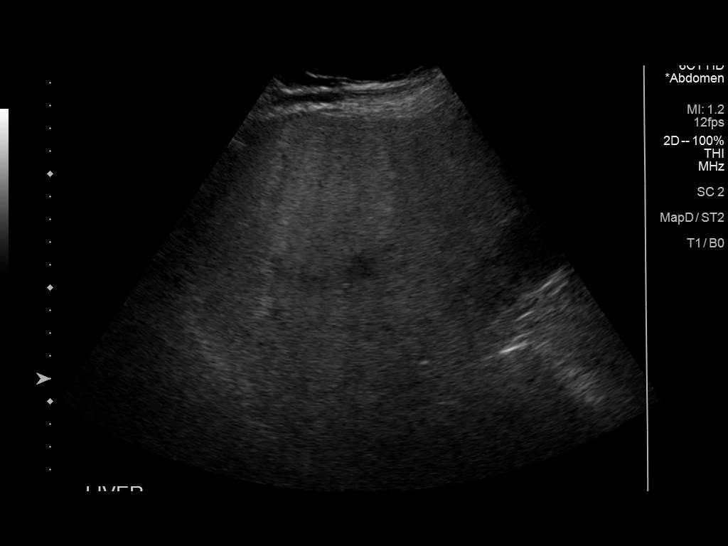
[im 23/37]
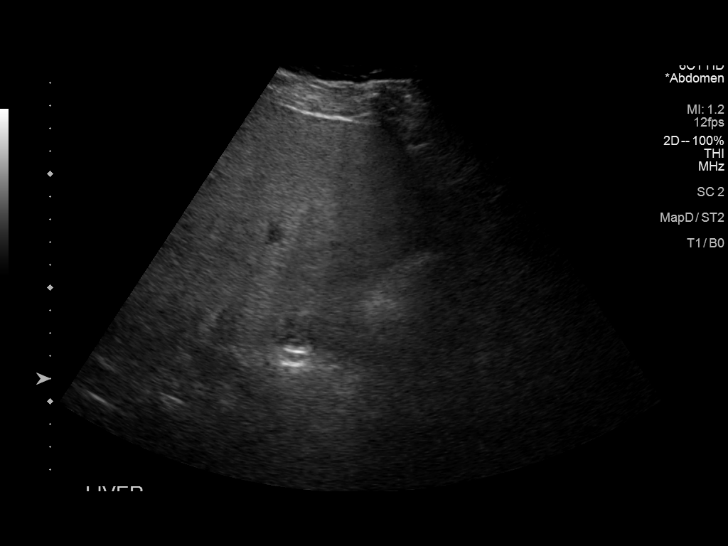
[im 25/37]
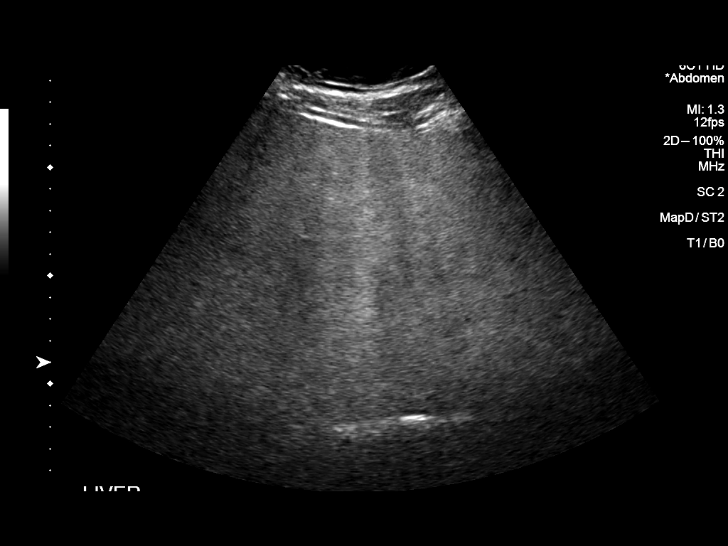
[im 28/37]
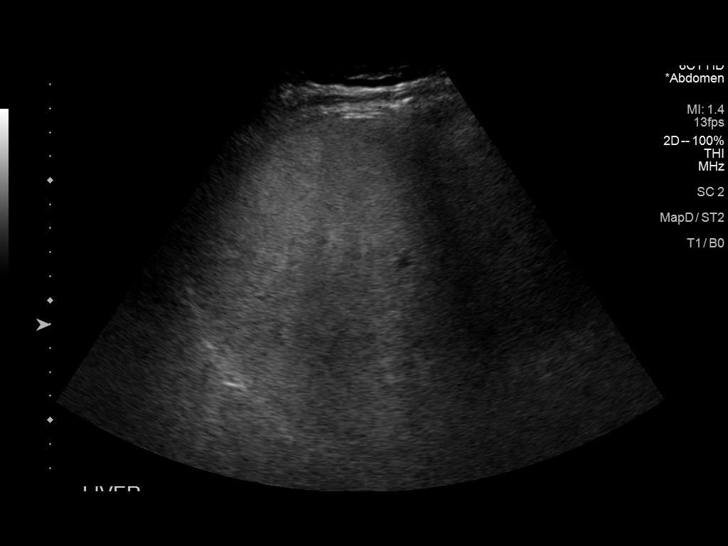
[im 31/37]
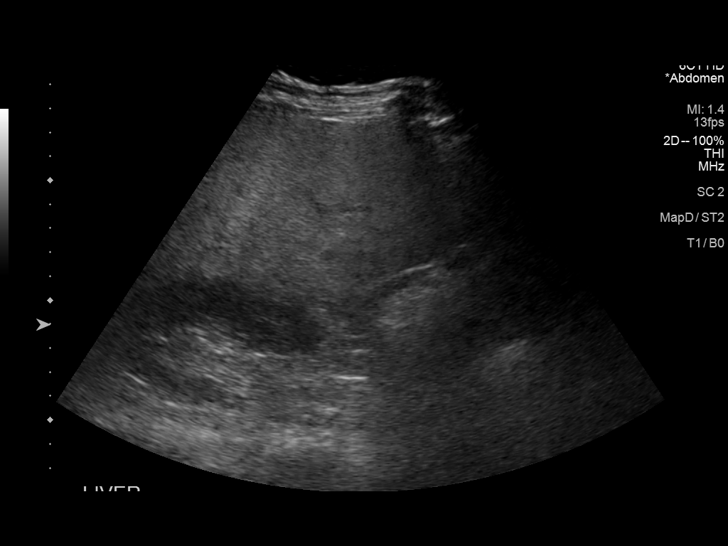
[im 34/37]
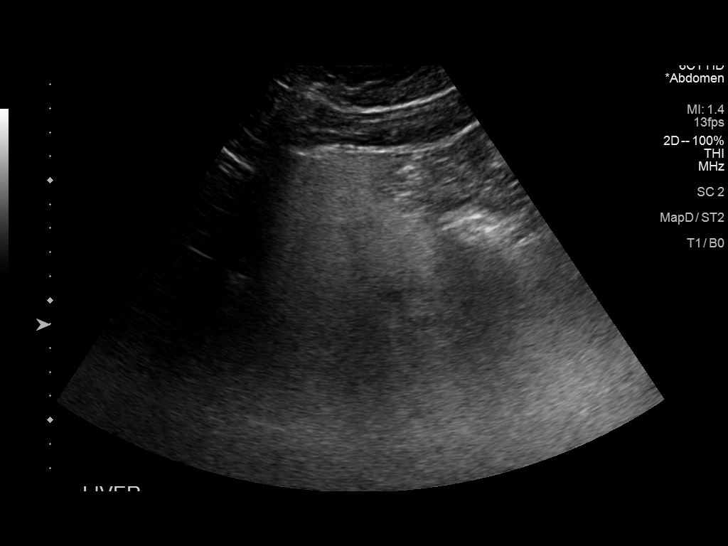
[im 37/37]
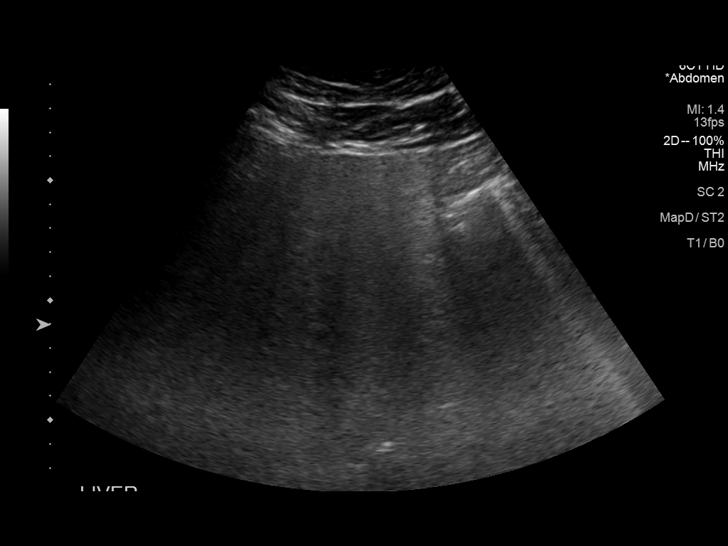

[14 of 25 positions shown; findings below may reference images not displayed]

FINDINGS: Gallbladder:

Physiologically distended. No gallstones or wall thickening
visualized. No sonographic Murphy sign noted by sonographer.

Common bile duct:

Diameter: 4 mm, normal.

Liver:

Diffusely increased in parenchymal echogenicity, heterogeneous
echotexture. The liver parenchyma is difficult to penetrate. No
evidence of focal lesion. No capsular nodularity.

Other: No right upper quadrant ascites.
IMPRESSION: 1. Diffusely increased heterogeneous hepatic parenchyma typical of
steatosis, moderately advanced. No focal lesion.
2. Normal sonographic appearance of the gallbladder and biliary
tree.

## 2022-01-04 DIAGNOSIS — E291 Testicular hypofunction: Secondary | ICD-10-CM | POA: Diagnosis not present

## 2022-01-04 DIAGNOSIS — Z79890 Hormone replacement therapy (postmenopausal): Secondary | ICD-10-CM | POA: Diagnosis not present

## 2022-01-06 DIAGNOSIS — E291 Testicular hypofunction: Secondary | ICD-10-CM | POA: Diagnosis not present

## 2022-01-06 DIAGNOSIS — G4730 Sleep apnea, unspecified: Secondary | ICD-10-CM | POA: Diagnosis not present

## 2022-01-06 DIAGNOSIS — Z6838 Body mass index (BMI) 38.0-38.9, adult: Secondary | ICD-10-CM | POA: Diagnosis not present

## 2022-01-06 DIAGNOSIS — F419 Anxiety disorder, unspecified: Secondary | ICD-10-CM | POA: Diagnosis not present

## 2022-02-13 ENCOUNTER — Other Ambulatory Visit: Payer: Self-pay | Admitting: Family Medicine

## 2022-02-14 ENCOUNTER — Encounter: Payer: Self-pay | Admitting: Family Medicine

## 2022-02-15 ENCOUNTER — Other Ambulatory Visit: Payer: Self-pay

## 2022-02-15 MED ORDER — VALACYCLOVIR HCL 1 G PO TABS: 2000.0000 mg | ORAL_TABLET | Freq: Two times a day (BID) | ORAL | 2 refills | Status: DC

## 2022-02-17 DIAGNOSIS — B001 Herpesviral vesicular dermatitis: Secondary | ICD-10-CM | POA: Diagnosis not present

## 2022-03-22 ENCOUNTER — Encounter: Payer: Self-pay | Admitting: Family Medicine

## 2022-03-23 MED ORDER — CLONAZEPAM 0.5 MG PO TABS: 0.5000 mg | ORAL_TABLET | Freq: Every day | ORAL | 1 refills | Status: DC

## 2022-03-29 DIAGNOSIS — Z79890 Hormone replacement therapy (postmenopausal): Secondary | ICD-10-CM | POA: Diagnosis not present

## 2022-03-29 DIAGNOSIS — E291 Testicular hypofunction: Secondary | ICD-10-CM | POA: Diagnosis not present

## 2022-04-07 DIAGNOSIS — Z6837 Body mass index (BMI) 37.0-37.9, adult: Secondary | ICD-10-CM | POA: Diagnosis not present

## 2022-04-07 DIAGNOSIS — E291 Testicular hypofunction: Secondary | ICD-10-CM | POA: Diagnosis not present

## 2022-04-07 DIAGNOSIS — R768 Other specified abnormal immunological findings in serum: Secondary | ICD-10-CM | POA: Diagnosis not present

## 2022-05-19 ENCOUNTER — Encounter: Payer: Self-pay | Admitting: Family Medicine

## 2022-05-19 ENCOUNTER — Ambulatory Visit: Payer: BC Managed Care – PPO | Admitting: Family Medicine

## 2022-05-19 VITALS — BP 126/70 | HR 73 | Temp 99.2°F | Ht 67.0 in | Wt 239.4 lb

## 2022-05-19 DIAGNOSIS — B001 Herpesviral vesicular dermatitis: Secondary | ICD-10-CM

## 2022-05-19 DIAGNOSIS — L249 Irritant contact dermatitis, unspecified cause: Secondary | ICD-10-CM | POA: Diagnosis not present

## 2022-05-19 MED ORDER — VALACYCLOVIR HCL 1 G PO TABS: 2000.0000 mg | ORAL_TABLET | Freq: Two times a day (BID) | ORAL | 2 refills | Status: DC

## 2022-05-19 NOTE — Progress Notes (Signed)
Subjective  CC:  Chief Complaint  Patient presents with   Spot on chest    Pt stated that he has a red spot on his chest that has been there x1 week. No pain and it does not itch    HPI: Shannon Ibarra is a 46 y.o. male who presents to the office today to address the problems listed above in the chief complaint. 46 year old male with round red spot on right upper chest.  Came abruptly 1 week ago.  Has not changed at all.  Mild itching this morning but mostly asymptomatic.  No systemic symptoms.  Has been working in the yard.  No obvious insect bites or stings.  No flaking. Has history of recurrent cold sores.  Request Valtrex to be used as needed Assessment  1. Irritant dermatitis   2. Recurrent cold sores      Plan  Irritant dermatitis: Differential includes contact dermatitis, allergic reaction, insect bite, heat rash or other.  Discussed with patient.  Trial of triamcinolone cream twice daily.  Follow-up in office if not resolved in the next 2 to 4 weeks. Refill Valtrex for as needed use for cold sores  Follow up: As scheduled for physical Visit date not found  No orders of the defined types were placed in this encounter.  No orders of the defined types were placed in this encounter.     I reviewed the patients updated PMH, FH, and SocHx.    Patient Active Problem List   Diagnosis Date Noted   GAD (generalized anxiety disorder) 01/10/2018    Priority: High   Vestibular nerve damage 01/10/2018    Priority: High   Obstructive sleep apnea syndrome, severe 10/19/2016    Priority: High   Mixed hyperlipidemia 05/10/2008    Priority: High   Irritable bowel syndrome with diarrhea 05/04/2018    Priority: Medium    History of fatty infiltration of liver 01/10/2018    Priority: Medium    Nonulcer dyspepsia 01/10/2018    Priority: Medium    Morbid obesity ([place]) 10/21/2014    Priority: Medium    Hypogonadotropic hypogonadism in male [facility]) 02/19/2014    Priority: Medium     Acne rosacea 06/07/2014    Priority: Low   Drug-induced gynecomastia 02/19/2014    Priority: Low   Allergic rhinitis 05/10/2008    Priority: Low   Hepatic steatosis 10/19/2021   Non-cardiac chest pain 01/10/2018   Current Meds  Medication Sig   cetirizine (ZYRTEC) 10 MG tablet Take 10 mg by mouth daily.   cholecalciferol (VITAMIN D3) 25 MCG (1000 UNIT) tablet Take 1,000 Units by mouth daily.   clonazePAM (KLONOPIN) 0.5 MG tablet Take 1 tablet (0.5 mg total) by mouth daily.   esomeprazole (NEXIUM) 40 MG capsule Take 20 mg by mouth daily.   ketoconazole (NIZORAL) 2 % shampoo Apply topically 2 (two) times a week. As needed   minocycline (MINOCIN) 100 MG capsule TAKE 1 CAPSULE TWICE A DAY   olopatadine (PATANOL) 0.1 % ophthalmic solution Place 1 drop into both eyes 2 (two) times daily.   Omega-3 Fatty Acids (FISH OIL) 1000 MG CAPS Take 1 capsule (1,000 mg total) by mouth 2 (two) times daily.   PARoxetine (PAXIL) 20 MG tablet Take 1 tablet (20 mg total) by mouth daily.   raloxifene (EVISTA) 60 MG tablet Take 1 tablet (60 mg total) by mouth daily.   simvastatin (ZOCOR) 20 MG tablet Take 1 tablet (20 mg total) by mouth at bedtime.  valACYclovir (VALTREX) 1000 MG tablet Take 2 tablets (2,000 mg total) by mouth 2 (two) times daily. Once, as needed for cold sores    Allergies: Patient is allergic to nitroglycerin, bupropion, and dust mite extract. Family History: Patient family history includes Colon polyps in his father; Hypertension in his brother and mother; Throat cancer in his father. Social History:  Patient  reports that he quit smoking about 15 years ago. His smoking use included cigarettes. He has a 12.00 pack-year smoking history. He has never used smokeless tobacco. He reports current alcohol use of about 2.0 standard drinks per week. He reports that he does not use drugs.  Review of Systems: Constitutional: Negative for fever malaise or anorexia Cardiovascular: negative for  chest pain Respiratory: negative for SOB or persistent cough Gastrointestinal: negative for abdominal pain  Objective  Vitals: BP 126/70   Pulse 73   Temp 99.2 F (37.3 C)   Ht 5' 7"  (1.702 m)   Wt 239 lb 6.4 oz (108.6 kg)   SpO2 99%   BMI 37.50 kg/m  General: no acute distress , A&Ox3 Skin:  Warm, right upper chest with quarter sized annular erythematous micropapular lesion.  Nontender.  No vesicles or flaking    Commons side effects, risks, benefits, and alternatives for medications and treatment plan prescribed today were discussed, and the patient expressed understanding of the given instructions. Patient is instructed to call or message via MyChart if he/she has any questions or concerns regarding our treatment plan. No barriers to understanding were identified. We discussed Red Flag symptoms and signs in detail. Patient expressed understanding regarding what to do in case of urgent or emergency type symptoms.  Medication list was reconciled, printed and provided to the patient in AVS. Patient instructions and summary information was reviewed with the patient as documented in the AVS. This note was prepared with assistance of Dragon voice recognition software. Occasional wrong-word or sound-a-like substitutions may have occurred due to the inherent limitations of voice recognition software  This visit occurred during the SARS-CoV-2 public health emergency.  Safety protocols were in place, including screening questions prior to the visit, additional usage of staff PPE, and extensive cleaning of exam room while observing appropriate contact time as indicated for disinfecting solutions.

## 2022-06-04 DIAGNOSIS — Z6836 Body mass index (BMI) 36.0-36.9, adult: Secondary | ICD-10-CM | POA: Diagnosis not present

## 2022-06-04 DIAGNOSIS — J3089 Other allergic rhinitis: Secondary | ICD-10-CM | POA: Diagnosis not present

## 2022-06-04 DIAGNOSIS — G4733 Obstructive sleep apnea (adult) (pediatric): Secondary | ICD-10-CM | POA: Diagnosis not present

## 2022-06-16 DIAGNOSIS — G4733 Obstructive sleep apnea (adult) (pediatric): Secondary | ICD-10-CM | POA: Diagnosis not present

## 2022-07-16 DIAGNOSIS — G4733 Obstructive sleep apnea (adult) (pediatric): Secondary | ICD-10-CM | POA: Diagnosis not present

## 2022-07-25 ENCOUNTER — Encounter: Payer: Self-pay | Admitting: Family Medicine

## 2022-07-26 ENCOUNTER — Telehealth: Payer: Self-pay | Admitting: Family Medicine

## 2022-07-26 DIAGNOSIS — Z1329 Encounter for screening for other suspected endocrine disorder: Secondary | ICD-10-CM | POA: Diagnosis not present

## 2022-07-26 DIAGNOSIS — Z79890 Hormone replacement therapy (postmenopausal): Secondary | ICD-10-CM | POA: Diagnosis not present

## 2022-07-26 DIAGNOSIS — E291 Testicular hypofunction: Secondary | ICD-10-CM | POA: Diagnosis not present

## 2022-07-26 NOTE — Telephone Encounter (Signed)
Patient called to schedule annual physical. Patient was able to be scheduled on 11/27 @ 2pm, but would like to know if he could have labs done earlier in the day. Please advise.

## 2022-07-27 ENCOUNTER — Other Ambulatory Visit: Payer: Self-pay

## 2022-07-27 DIAGNOSIS — Z0000 Encounter for general adult medical examination without abnormal findings: Secondary | ICD-10-CM

## 2022-07-27 NOTE — Telephone Encounter (Signed)
Labs has been placed for Future

## 2022-07-28 DIAGNOSIS — Z6836 Body mass index (BMI) 36.0-36.9, adult: Secondary | ICD-10-CM | POA: Diagnosis not present

## 2022-07-28 DIAGNOSIS — R768 Other specified abnormal immunological findings in serum: Secondary | ICD-10-CM | POA: Diagnosis not present

## 2022-07-28 DIAGNOSIS — E291 Testicular hypofunction: Secondary | ICD-10-CM | POA: Diagnosis not present

## 2022-07-28 DIAGNOSIS — K760 Fatty (change of) liver, not elsewhere classified: Secondary | ICD-10-CM | POA: Diagnosis not present

## 2022-08-16 DIAGNOSIS — G4733 Obstructive sleep apnea (adult) (pediatric): Secondary | ICD-10-CM | POA: Diagnosis not present

## 2022-09-10 ENCOUNTER — Encounter: Payer: Self-pay | Admitting: Family Medicine

## 2022-09-12 ENCOUNTER — Other Ambulatory Visit: Payer: Self-pay | Admitting: Family Medicine

## 2022-09-13 DIAGNOSIS — R509 Fever, unspecified: Secondary | ICD-10-CM | POA: Diagnosis not present

## 2022-09-13 DIAGNOSIS — R6889 Other general symptoms and signs: Secondary | ICD-10-CM | POA: Diagnosis not present

## 2022-09-13 DIAGNOSIS — Z03818 Encounter for observation for suspected exposure to other biological agents ruled out: Secondary | ICD-10-CM | POA: Diagnosis not present

## 2022-09-15 ENCOUNTER — Other Ambulatory Visit: Payer: Self-pay

## 2022-09-15 ENCOUNTER — Encounter: Payer: Self-pay | Admitting: Family Medicine

## 2022-09-15 MED ORDER — NICOTINE 10 MG IN INHA: 1.0000 | RESPIRATORY_TRACT | 5 refills | Status: DC | PRN

## 2022-09-17 DIAGNOSIS — G4733 Obstructive sleep apnea (adult) (pediatric): Secondary | ICD-10-CM | POA: Diagnosis not present

## 2022-09-20 ENCOUNTER — Encounter: Payer: Self-pay | Admitting: *Deleted

## 2022-09-27 ENCOUNTER — Other Ambulatory Visit: Payer: Self-pay | Admitting: Family Medicine

## 2022-09-28 DIAGNOSIS — Z23 Encounter for immunization: Secondary | ICD-10-CM | POA: Diagnosis not present

## 2022-10-27 DIAGNOSIS — Z79890 Hormone replacement therapy (postmenopausal): Secondary | ICD-10-CM | POA: Diagnosis not present

## 2022-10-27 DIAGNOSIS — E291 Testicular hypofunction: Secondary | ICD-10-CM | POA: Diagnosis not present

## 2022-11-03 DIAGNOSIS — G4730 Sleep apnea, unspecified: Secondary | ICD-10-CM | POA: Diagnosis not present

## 2022-11-03 DIAGNOSIS — Z6837 Body mass index (BMI) 37.0-37.9, adult: Secondary | ICD-10-CM | POA: Diagnosis not present

## 2022-11-03 DIAGNOSIS — E291 Testicular hypofunction: Secondary | ICD-10-CM | POA: Diagnosis not present

## 2022-11-15 ENCOUNTER — Other Ambulatory Visit (INDEPENDENT_AMBULATORY_CARE_PROVIDER_SITE_OTHER): Payer: BC Managed Care – PPO

## 2022-11-15 DIAGNOSIS — Z0000 Encounter for general adult medical examination without abnormal findings: Secondary | ICD-10-CM

## 2022-11-15 LAB — CBC WITH DIFFERENTIAL/PLATELET
Basophils Absolute: 0 10*3/uL (ref 0.0–0.1)
Basophils Relative: 0.9 % (ref 0.0–3.0)
Eosinophils Absolute: 0.1 10*3/uL (ref 0.0–0.7)
Eosinophils Relative: 1.8 % (ref 0.0–5.0)
HCT: 48.7 % (ref 39.0–52.0)
Hemoglobin: 16.8 g/dL (ref 13.0–17.0)
Lymphocytes Relative: 26.8 % (ref 12.0–46.0)
Lymphs Abs: 1.4 10*3/uL (ref 0.7–4.0)
MCHC: 34.5 g/dL (ref 30.0–36.0)
MCV: 94.8 fl (ref 78.0–100.0)
Monocytes Absolute: 0.5 10*3/uL (ref 0.1–1.0)
Monocytes Relative: 8.9 % (ref 3.0–12.0)
Neutro Abs: 3.2 10*3/uL (ref 1.4–7.7)
Neutrophils Relative %: 61.6 % (ref 43.0–77.0)
Platelets: 233 10*3/uL (ref 150.0–400.0)
RBC: 5.14 Mil/uL (ref 4.22–5.81)
RDW: 12.5 % (ref 11.5–15.5)
WBC: 5.2 10*3/uL (ref 4.0–10.5)

## 2022-11-15 LAB — LIPID PANEL
Cholesterol: 159 mg/dL (ref 0–200)
HDL: 45.1 mg/dL (ref >39.00)
LDL Cholesterol: 87 mg/dL (ref 0–99)
NonHDL: 113.64
Total CHOL/HDL Ratio: 4
Triglycerides: 132 mg/dL (ref 0.0–149.0)
VLDL: 26.4 mg/dL (ref 0.0–40.0)

## 2022-11-15 LAB — COMPREHENSIVE METABOLIC PANEL
ALT: 56 U/L — ABNORMAL HIGH (ref 0–53)
AST: 41 U/L — ABNORMAL HIGH (ref 0–37)
Albumin: 4.7 g/dL (ref 3.5–5.2)
Alkaline Phosphatase: 46 U/L (ref 39–117)
BUN: 15 mg/dL (ref 6–23)
CO2: 25 mEq/L (ref 19–32)
Calcium: 9.4 mg/dL (ref 8.4–10.5)
Chloride: 98 mEq/L (ref 96–112)
Creatinine, Ser: 0.94 mg/dL (ref 0.40–1.50)
GFR: 96.3 mL/min (ref >60.00)
Glucose, Bld: 78 mg/dL (ref 70–99)
Potassium: 4.4 mEq/L (ref 3.5–5.1)
Sodium: 133 mEq/L — ABNORMAL LOW (ref 135–145)
Total Bilirubin: 0.9 mg/dL (ref 0.2–1.2)
Total Protein: 7.6 g/dL (ref 6.0–8.3)

## 2022-11-15 LAB — TSH: TSH: 0.88 u[IU]/mL (ref 0.35–5.50)

## 2022-11-15 LAB — SEDIMENTATION RATE: Sed Rate: 15 mm/hr (ref 0–15)

## 2022-11-18 LAB — ANA: Anti Nuclear Antibody (ANA): NEGATIVE

## 2022-11-18 LAB — HEPATITIS B CORE ANTIBODY, TOTAL: Hep B Core Total Ab: NONREACTIVE

## 2022-11-18 LAB — MITOCHONDRIAL ANTIBODIES: Mitochondrial M2 Ab, IgG: <=20 U (ref <=20.0)

## 2022-11-18 LAB — ANTI-SMOOTH MUSCLE ANTIBODY, IGG: Actin (Smooth Muscle) Antibody (IGG): <20 U (ref <20)

## 2022-11-18 LAB — HEPATITIS B SURFACE ANTIGEN: Hepatitis B Surface Ag: NONREACTIVE

## 2022-11-18 LAB — HEPATITIS C ANTIBODY: Hepatitis C Ab: NONREACTIVE

## 2022-11-18 LAB — HEPATITIS B SURFACE ANTIBODY,QUALITATIVE: Hep B S Ab: NONREACTIVE

## 2022-11-22 ENCOUNTER — Encounter: Payer: BC Managed Care – PPO | Admitting: Family Medicine

## 2022-11-22 NOTE — Progress Notes (Signed)
Stable labs to discuss at upcoming [facility] visit 11/29/2022

## 2022-11-23 LAB — ANTI-NUCLEAR AB BY IFA (RDL): Anti-Nuclear Ab by IFA (RDL): POSITIVE — AB

## 2022-11-23 LAB — ANA TITER AND PATTERN: Speckled Pattern: 1:160 {titer} — ABNORMAL HIGH

## 2022-11-29 ENCOUNTER — Ambulatory Visit (INDEPENDENT_AMBULATORY_CARE_PROVIDER_SITE_OTHER): Payer: BC Managed Care – PPO | Admitting: Family Medicine

## 2022-11-29 ENCOUNTER — Encounter: Payer: Self-pay | Admitting: Family Medicine

## 2022-11-29 VITALS — BP 136/78 | HR 71 | Temp 98.7°F | Ht 67.0 in | Wt 241.4 lb

## 2022-11-29 DIAGNOSIS — G4733 Obstructive sleep apnea (adult) (pediatric): Secondary | ICD-10-CM

## 2022-11-29 DIAGNOSIS — Z0000 Encounter for general adult medical examination without abnormal findings: Secondary | ICD-10-CM

## 2022-11-29 DIAGNOSIS — F411 Generalized anxiety disorder: Secondary | ICD-10-CM | POA: Diagnosis not present

## 2022-11-29 DIAGNOSIS — E669 Obesity, unspecified: Secondary | ICD-10-CM

## 2022-11-29 DIAGNOSIS — E782 Mixed hyperlipidemia: Secondary | ICD-10-CM | POA: Diagnosis not present

## 2022-11-29 DIAGNOSIS — F17210 Nicotine dependence, cigarettes, uncomplicated: Secondary | ICD-10-CM

## 2022-11-29 DIAGNOSIS — K760 Fatty (change of) liver, not elsewhere classified: Secondary | ICD-10-CM

## 2022-11-29 DIAGNOSIS — K580 Irritable bowel syndrome with diarrhea: Secondary | ICD-10-CM

## 2022-11-29 DIAGNOSIS — E230 Hypopituitarism: Secondary | ICD-10-CM

## 2022-11-29 MED ORDER — PAROXETINE HCL 20 MG PO TABS: 20.0000 mg | ORAL_TABLET | Freq: Every day | ORAL | 3 refills | Status: DC

## 2022-11-29 MED ORDER — ESOMEPRAZOLE MAGNESIUM 20 MG PO CPDR: 20.0000 mg | DELAYED_RELEASE_CAPSULE | Freq: Every day | ORAL | Status: DC

## 2022-11-29 NOTE — Progress Notes (Signed)
Subjective  Chief Complaint  Patient presents with   Annual Exam    Pt here for Annual exam and is not currently fasting     HPI: Shannon Ibarra is a 46 y.o. male who presents to [facility] today for a Male Wellness Visit. He also has the concerns and/or needs as listed above in the chief complaint. These will be addressed in addition to the Health Maintenance Visit.   Wellness Visit: annual visit with health maintenance review and exam   HM: screens current. Exercises. Smoking 1ppd and not interested in quitting at this time. No sxs. Used nicotrol inhaler x 1 week but then started smoking again. Imms up to date.  Body mass index is 37.81 kg/m. Wt Readings from Last 3 Encounters:  11/29/22 241 lb 6.4 oz (109.5 kg)  05/19/22 239 lb 6.4 oz (108.6 kg)  10/19/21 256 lb 6.4 oz (116.3 kg)     Chronic disease management visit and/or acute problem visit: HLD on pravastatin 40 and LDL 82. No adverse effects.  Fatty liver with last ultrasound showing moderate infiltration. Eats low fat. No pain or swelling. Does not have current GI or hepatologist.  OSA on cpap IBS and GERD: controlled with diet and nexium 20 daily otc GAD: doing well on paxil. A bit down during the holidays: misses his mother.   Patient Active Problem List   Diagnosis Date Noted   GAD (generalized anxiety disorder) 01/10/2018   Vestibular nerve damage 01/10/2018   Obstructive sleep apnea syndrome, severe 10/19/2016   Mixed hyperlipidemia 05/10/2008   Hepatic steatosis 10/19/2021   Irritable bowel syndrome with diarrhea 05/04/2018   Nonulcer dyspepsia 01/10/2018   Hypogonadotropic hypogonadism in male [facility]) 02/19/2014   Acne rosacea 06/07/2014   Drug-induced gynecomastia 02/19/2014   Allergic rhinitis 05/10/2008   Non-cardiac chest pain 01/10/2018   Health Maintenance  Topic Date Due   COVID-19 Vaccine (7 - 2023-24 season) 12/15/2022 (Originally 11/05/2022)   DTaP/Tdap/Td (3 - Td or  Tdap) 12/06/2023   Fecal DNA (Cologuard)  03/30/2024   INFLUENZA VACCINE  Completed   Hepatitis C Screening  Completed   HIV Screening  Completed   HPV VACCINES  Aged Out   Immunization History  Administered Date(s) Administered   Influenza Inj Mdck Quad With Preservative 10/05/2021   Influenza Split 10/17/2015   Influenza, Quadrivalent, Recombinant, Inj, Pf 09/01/2018   Influenza,inj,Quad PF,6+ Mos 11/03/2017, 09/12/2020, 10/05/2021, 09/10/2022   PFIZER(Purple Top)SARS-COV-2 Vaccination 03/05/2020, 03/26/2020, 10/13/2020   Pfizer Covid-19 Vaccine Bivalent Booster 52yrs & up 10/05/2021, 10/05/2021, 09/10/2022   Td 12/27/2002   Tdap 12/05/2013   We updated and reviewed the patient's past history in detail and it is documented below. Allergies: Patient is allergic to nitroglycerin, bupropion, and dust mite extract. Past Medical History  has a past medical history of Acne, Allergic rhinitis, Anxiety, Fatty liver, Hyperlipidemia (11/2282013), Irritable bowel syndrome with diarrhea (05/04/2018), Nonulcer dyspepsia (01/10/2018), Rosacea, acne (06/07/2014), Vestibular nerve damage (01/10/2018), and Vestibular neuritis (10/13/2017). Past Surgical History Patient  has a past surgical history that includes Tonsillectomy. Social History Patient  reports that he quit smoking about 15 years ago. His smoking use included cigarettes. He has a 12.00 pack-year smoking history. He has never used smokeless tobacco. He reports current alcohol use of about 2.0 standard drinks of alcohol per week. He reports that he does not use drugs. Family History family history includes Colon polyps in his father; Hypertension in his brother and mother; Throat cancer in  his father. Review of Systems: Constitutional: negative for fever or malaise Ophthalmic: negative for photophobia, double vision or loss of vision Cardiovascular: negative for chest pain, dyspnea on exertion, or new LE swelling Respiratory: negative for  SOB or persistent cough Gastrointestinal: negative for abdominal pain, change in bowel habits or melena Genitourinary: negative for dysuria or gross hematuria Musculoskeletal: negative for new gait disturbance or muscular weakness Integumentary: negative for new or persistent rashes Neurological: negative for TIA or stroke symptoms Psychiatric: negative for SI or delusions Allergic/Immunologic: negative for hives  Patient Care Team    Relationship Specialty Notifications Start End  Willow Ora, MD PCP - General Family Medicine  01/10/18   Marnette Burgess, MD  Gastroenterology  01/10/18   Warden Fillers, MD Consulting Physician Endocrinology  01/10/18   Randolm Idol, MD Consulting Physician Pulmonary Disease  01/10/18    Objective  Vitals: BP 136/78   Pulse 71   Temp 98.7 F (37.1 C)   Ht 5\' 7"  (1.702 m)   Wt 241 lb 6.4 oz (109.5 kg)   SpO2 94%   BMI 37.81 kg/m  General:  Well developed, well nourished, no acute distress  Psych:  Alert and orientedx3,normal mood and affect HEENT:  Normocephalic, atraumatic, non-icteric sclera, PERRL, oropharynx is clear without mass or exudate, supple neck without adenopathy, mass or thyromegaly Cardiovascular:  Normal S1, S2, RRR without gallop, rub or murmur, nondisplaced PMI, +2 distal pulses in bilateral upper and lower extremities. Respiratory:  Good breath sounds bilaterally, CTAB with normal respiratory effort Gastrointestinal: normal bowel sounds, soft, non-tender, no noted masses. No HSM MSK: no deformities, contusions. Joints are without erythema or swelling. Spine and CVA region are nontender Skin:  Warm, no rashes or suspicious lesions noted Neurologic:    Mental status is normal.  Gross motor and sensory exams are normal. Stable gait. No tremor GU: No inguinal hernias or adenopathy are appreciated bilaterally   Lab Results  Component Value Date   CREATININE 0.94 11/15/2022   BUN 15 11/15/2022   NA 133 (L) 11/15/2022   K  4.4 11/15/2022   CL 98 11/15/2022   CO2 25 11/15/2022   Lab Results  Component Value Date   ALT 56 (H) 11/15/2022   AST 41 (H) 11/15/2022   ALKPHOS 46 11/15/2022   BILITOT 0.9 11/15/2022   Lab Results  Component Value Date   CHOL 159 11/15/2022   CHOL 175 10/14/2021   CHOL 168 09/15/2020   Lab Results  Component Value Date   HDL 45.10 11/15/2022   HDL 41.80 10/14/2021   HDL 43 09/15/2020   Lab Results  Component Value Date   LDLCALC 87 11/15/2022   LDLCALC 105 (H) 10/14/2021   LDLCALC 92 09/15/2020   Lab Results  Component Value Date   TRIG 132.0 11/15/2022   TRIG 143.0 10/14/2021   TRIG 250 (H) 09/15/2020   Lab Results  Component Value Date   CHOLHDL 4 11/15/2022   CHOLHDL 4 10/14/2021   CHOLHDL 3.9 09/15/2020   Lab Results  Component Value Date   LDLDIRECT 88.0 11/20/2019   LDLDIRECT 102.0 07/20/2018   LDLDIRECT 123.0 05/04/2018   Lab Results  Component Value Date   TSH 0.88 11/15/2022   Lab Results  Component Value Date   WBC 5.2 11/15/2022   HGB 16.8 11/15/2022   HCT 48.7 11/15/2022   MCV 94.8 11/15/2022   PLT 233.0 11/15/2022   The 10-year ASCVD risk score (Arnett DK, et al., 2019) is: 2.2%  Values used to calculate the score:     Age: 12 years     Sex: Male     Is Non-Hispanic African American: No     Diabetic: No     Tobacco smoker: No     Systolic Blood Pressure: 136 mmHg     Is BP treated: No     HDL Cholesterol: 45.1 mg/dL     Total Cholesterol: 159 mg/dL  Assessment  1. Annual physical exam   2. Mixed hyperlipidemia   3. GAD (generalized anxiety disorder)   4. Obstructive sleep apnea syndrome, severe   5. Hepatic steatosis   6. Irritable bowel syndrome with diarrhea   7. Hypogonadotropic hypogonadism in male [facility]) Chronic     Plan  Male Wellness Visit: Age appropriate Health Maintenance and Prevention measures were discussed with patient. Included topics are cancer screening recommendations, ways to keep healthy (see AVS)  including dietary and exercise recommendations, regular eye and dental care, use of seat belts, and avoidance of moderate alcohol use and tobacco use. Pt still smoking. BMI: discussed patient's BMI and encouraged positive lifestyle modifications to help get to or maintain a target BMI. HM needs and immunizations were addressed and ordered. See below for orders. See HM and immunization section for updates. Routine labs and screening tests ordered including cmp, cbc and lipids where appropriate. Discussed recommendations regarding Vit D and calcium supplementation (see AVS)  Chronic disease f/u and/or acute problem visit: (deemed necessary to be done in addition to the wellness visit): Hepatic steatosis: rec low fat diet. Weight loss. We discussed GLP 1 meds. Discussed hepatology to better discern if liver fibrosis is present but pt declines at this time.  Refilled paxil for well controlled GAD, 20 daily.  Gerd: continue nex 20 daily IBS; stable Cont cpap for osa HLD is improved on pravastatin 40 nightly.   Follow up: 12 mo for cpe  Commons side effects, risks, benefits, and alternatives for medications and treatment plan prescribed today were discussed, and the patient expressed understanding of the given instructions. Patient is instructed to call or message via MyChart if he/she has any questions or concerns regarding our treatment plan. No barriers to understanding were identified. We discussed Red Flag symptoms and signs in detail. Patient expressed understanding regarding what to do in case of urgent or emergency type symptoms.  Medication list was reconciled, printed and provided to the patient in AVS. Patient instructions and summary information was reviewed with the patient as documented in the AVS. This note was prepared with assistance of Dragon voice recognition software. Occasional wrong-word or sound-a-like substitutions may have occurred due to the inherent limitations of voice  recognition software    No orders of the defined types were placed in this encounter.  Meds ordered this encounter  Medications   PARoxetine (PAXIL) 20 MG tablet    Sig: Take 1 tablet (20 mg total) by mouth daily.    Dispense:  90 tablet    Refill:  3

## 2022-11-29 NOTE — Patient Instructions (Signed)
Please return in 12 months for your annual complete physical; please come fasting.   If you have any questions or concerns, please don't hesitate to send me a message via MyChart or call the office at [phone]. Thank you for visiting with Shannon Ibarra today! It's our pleasure caring for you.   Fatty Liver Disease  The liver converts food into energy, removes toxic material from the blood, makes important proteins, and absorbs necessary vitamins from food. Fatty liver disease occurs when too much fat has built up in your liver cells. Fatty liver disease is also called hepatic steatosis. In many cases, fatty liver disease does not cause symptoms or problems. It is often diagnosed when tests are being done for other reasons. However, over time, fatty liver can cause inflammation that may lead to more serious liver problems, such as scarring of the liver (cirrhosis) and liver failure. Fatty liver is associated with insulin resistance, increased body fat, high blood pressure (hypertension), and high cholesterol. These are features of metabolic syndrome and increase your risk for stroke, diabetes, and heart disease. What are the causes? This condition may be caused by components of metabolic syndrome: Obesity. Insulin resistance. High cholesterol. Other causes: Alcohol abuse. Poor nutrition. Cushing syndrome. Pregnancy. Certain drugs. Poisons. Some viral infections. What increases the risk? You are more likely to develop this condition if you: Abuse alcohol. Are overweight. Have diabetes. Have hepatitis. Have a high triglyceride level. Are pregnant. What are the signs or symptoms? Fatty liver disease often does not cause symptoms. If symptoms do develop, they can include: Fatigue and weakness. Weight loss. Confusion. Nausea, vomiting, or abdominal pain. Yellowing of your skin and the white parts of your eyes (jaundice). Itchy skin. How is this diagnosed? This condition may be diagnosed  by: A physical exam and your medical history. Blood tests. Imaging tests, such as an ultrasound, CT scan, or MRI. A liver biopsy. A small sample of liver tissue is removed using a needle. The sample is then looked at under a microscope. How is this treated? Fatty liver disease is often caused by other health conditions. Treatment for fatty liver may involve medicines and lifestyle changes to manage conditions such as: Alcoholism. High cholesterol. Diabetes. Being overweight or obese. Follow these instructions at home:  Do not drink alcohol. If you have trouble quitting, ask your health care provider how to safely quit with the help of medicine or a supervised program. This is important to keep your condition from getting worse. Eat a healthy diet as told by your health care provider. Ask your health care provider about working with a dietitian to develop an eating plan. Exercise regularly. This can help you lose weight and control your cholesterol and diabetes. Talk to your health care provider about an exercise plan and which activities are best for you. Take over-the-counter and prescription medicines only as told by your health care provider. Keep all follow-up visits. This is important. Contact a health care provider if: You have trouble controlling your: Blood sugar. This is especially important if you have diabetes. Cholesterol. Drinking of alcohol. Get help right away if: You have abdominal pain. You have jaundice. You have nausea and are vomiting. You vomit blood or material that looks like coffee grounds. You have stools that are black, tar-like, or bloody. Summary Fatty liver disease develops when too much fat builds up in the cells of your liver. Fatty liver disease often causes no symptoms or problems. However, over time, fatty liver can cause inflammation that  may lead to more serious liver problems, such as scarring of the liver (cirrhosis). You are more likely to  develop this condition if you abuse alcohol, are pregnant, are overweight, have diabetes, have hepatitis, or have high triglyceride or cholesterol levels. Contact your health care provider if you have trouble controlling your blood sugar, cholesterol, or drinking of alcohol. This information is not intended to replace advice given to you by your health care provider. Make sure you discuss any questions you have with your health care provider. Document Revised: 09/25/2020 Document Reviewed: 09/25/2020 Elsevier Patient Education  2023 [employer].

## 2022-12-22 ENCOUNTER — Encounter: Payer: Self-pay | Admitting: Family Medicine

## 2022-12-22 ENCOUNTER — Other Ambulatory Visit: Payer: Self-pay | Admitting: Family Medicine

## 2022-12-22 MED ORDER — VALACYCLOVIR HCL 1 G PO TABS: 2000.0000 mg | ORAL_TABLET | Freq: Two times a day (BID) | ORAL | 0 refills | Status: AC

## 2023-01-20 DIAGNOSIS — E291 Testicular hypofunction: Secondary | ICD-10-CM | POA: Diagnosis not present

## 2023-01-20 DIAGNOSIS — Z79890 Hormone replacement therapy (postmenopausal): Secondary | ICD-10-CM | POA: Diagnosis not present

## 2023-01-24 DIAGNOSIS — E291 Testicular hypofunction: Secondary | ICD-10-CM | POA: Diagnosis not present

## 2023-01-24 DIAGNOSIS — G479 Sleep disorder, unspecified: Secondary | ICD-10-CM | POA: Diagnosis not present

## 2023-01-24 DIAGNOSIS — Z6838 Body mass index (BMI) 38.0-38.9, adult: Secondary | ICD-10-CM | POA: Diagnosis not present

## 2023-02-11 ENCOUNTER — Encounter: Payer: Self-pay | Admitting: Internal Medicine

## 2023-02-11 ENCOUNTER — Ambulatory Visit: Payer: BC Managed Care – PPO | Admitting: Internal Medicine

## 2023-02-11 ENCOUNTER — Encounter: Payer: Self-pay | Admitting: Family Medicine

## 2023-02-11 VITALS — BP 134/76 | HR 71 | Temp 98.4°F | Ht 67.0 in | Wt 242.0 lb

## 2023-02-11 DIAGNOSIS — F17210 Nicotine dependence, cigarettes, uncomplicated: Secondary | ICD-10-CM

## 2023-02-11 DIAGNOSIS — J324 Chronic pansinusitis: Secondary | ICD-10-CM | POA: Diagnosis not present

## 2023-02-11 DIAGNOSIS — Z636 Dependent relative needing care at home: Secondary | ICD-10-CM

## 2023-02-11 MED ORDER — AMOXICILLIN-POT CLAVULANATE 875-125 MG PO TABS: 1.0000 | ORAL_TABLET | Freq: Two times a day (BID) | ORAL | 0 refills | Status: DC

## 2023-02-11 NOTE — Progress Notes (Signed)
[facility]: [id number]   Routine Medical Office Visit  Patient:  Shannon Ibarra      Age: 47 y.o.       Sex:  male  Date:   02/11/2023  PCP:    Leamon Arnt, [place] Provider: Loralee Pacas, MD   Problem Focused Charting:   Medical Decision Making per Assessment/Plan   [place] was seen today for possible sinus infection, bloody nasal mucus and sinus area tender to the touch.  Chronic pansinusitis -     Amoxicillin-Pot Clavulanate; Take 1 tablet by mouth 2 (two) times daily.  Dispense: 20 tablet; Refill: 0  Caregiver stress -     Ambulatory referral to Psychology   Encouraged to discontinue cigarettes - he has no intent due to stress right now Encouraged to use sinus rinses or simply saline Seems due to stress and heavy smoking. Encouraged to follow up with Primary Care Provider (PCP) in 10 days     Subjective - Clinical Presentation:   Shannon Ibarra is a 47 y.o. male  Patient Active Problem List   Diagnosis Date Noted   Obesity (BMI 30-39.9) 11/29/2022   Nicotine dependence, cigarettes, uncomplicated [id number]   Hepatic steatosis 10/19/2021   Irritable bowel syndrome with diarrhea 05/04/2018   GAD (generalized anxiety disorder) 01/10/2018   Non-cardiac chest pain 01/10/2018   Vestibular nerve damage 01/10/2018   Nonulcer dyspepsia 01/10/2018   Obstructive sleep apnea syndrome, severe 10/19/2016   Acne rosacea 06/07/2014   Drug-induced gynecomastia 02/19/2014   Hypogonadotropic hypogonadism in male [facility]) 02/19/2014   Mixed hyperlipidemia 05/10/2008   Allergic rhinitis 05/10/2008   Past Medical History:  Diagnosis Date   Acne    Allergic rhinitis    Allergist in [place]   Anxiety    Fatty liver    Hyperlipidemia 11/2282013   Irritable bowel syndrome with diarrhea 05/04/2018   Nonulcer dyspepsia 01/10/2018   Rosacea, acne 06/07/2014   Vestibular nerve damage 01/10/2018   Acute neuritis, labyrinthitis 09/2017;  [facility] eval.    Vestibular neuritis 10/13/2017    Outpatient Medications Prior to Visit  Medication Sig   anastrozole (ARIMIDEX) 1 MG tablet SMARTSIG:0.5 Tablet(s) By Mouth Once a Week   cetirizine (ZYRTEC) 10 MG tablet Take 10 mg by mouth daily.   cholecalciferol (VITAMIN D3) 25 MCG (1000 UNIT) tablet Take 1,000 Units by mouth daily.   clonazePAM (KLONOPIN) 0.5 MG tablet TAKE 1 TABLET DAILY   esomeprazole (NEXIUM) 20 MG capsule Take 1 capsule (20 mg total) by mouth daily.   ketoconazole (NIZORAL) 2 % shampoo Apply topically 2 (two) times a week. As needed   minocycline (MINOCIN) 100 MG capsule TAKE 1 CAPSULE TWICE A DAY   nicotine (NICOTROL) 10 MG inhaler Inhale 1 Cartridge (1 continuous puffing total) into the lungs as needed for smoking cessation. Rec 6-16/day; max is 16/day   olopatadine (PATANOL) 0.1 % ophthalmic solution Place 1 drop into both eyes 2 (two) times daily.   Omega-3 Fatty Acids (FISH OIL) 1000 MG CAPS Take 1 capsule (1,000 mg total) by mouth 2 (two) times daily.   PARoxetine (PAXIL) 20 MG tablet Take 1 tablet (20 mg total) by mouth daily.   simvastatin (ZOCOR) 20 MG tablet TAKE 1 TABLET AT BEDTIME   testosterone cypionate (DEPOTESTOSTERONE CYPIONATE) 200 MG/ML injection Inject into the muscle.   valACYclovir (VALTREX) 1000 MG tablet Take 2 tablets (2,000 mg total) by mouth 2 (two) times daily. Once, as needed for cold  sores   No facility-administered medications prior to visit.    Chief Complaint  Patient presents with   Possible sinus infection    For about ten days.   Bloody nasal mucus   Sinus area tender to the touch    Beside nose and under eye.     HPI  Patient's having nasal cavity pain and upper dental pain x 10 days Stress and increased cigarette smoking due to hospice for parents  Not interested in reducing smoking right now  Has neti pots Patient denies any systemic symptoms such as fever, body aches, or fatigue -  all in the sinuses for the  duration        Objective:  Physical Exam  BP 134/76 (BP Location: Left Arm, Patient Position: Sitting)   Pulse 71   Temp 98.4 F (36.9 C) (Temporal)   Ht 5' 7"$  (1.702 m)   Wt 242 lb (109.8 kg)   SpO2 99%   BMI 37.90 kg/m  Obese  by BMI criteria but truncal adiposity (waist circumference or caliper) should be used instead. Wt Readings from Last 10 Encounters:  02/11/23 242 lb (109.8 kg)  11/29/22 241 lb 6.4 oz (109.5 kg)  05/19/22 239 lb 6.4 oz (108.6 kg)  10/19/21 256 lb 6.4 oz (116.3 kg)  06/22/21 250 lb 3.2 oz (113.5 kg)  04/21/21 241 lb 12.8 oz (109.7 kg)  02/09/21 259 lb 6.4 oz (117.7 kg)  01/30/21 265 lb 6.4 oz (120.4 kg)  10/17/20 257 lb (116.6 kg)  09/12/20 259 lb 6.4 oz (117.7 kg)   Vital signs reviewed.  Nursing notes reviewed. Weight trend reviewed. General Appearance:  Well developed, well nourished male in no acute distress.   Normal work of breathing at rest Musculoskeletal: All extremities are intact.  Neurological:  Awake, alert,  No obvious focal neurological deficits or cognitive impairments Psychiatric:  Appropriate mood, pleasant demeanor Problem-specific findings:  inflamed sinuses with some scant blood. No dental caries      Signed: Loralee Pacas, MD 02/11/2023 10:11 AM

## 2023-02-14 ENCOUNTER — Other Ambulatory Visit: Payer: Self-pay

## 2023-02-14 MED ORDER — PAROXETINE HCL 20 MG PO TABS: 20.0000 mg | ORAL_TABLET | Freq: Every day | ORAL | 0 refills | Status: DC

## 2023-02-14 NOTE — Telephone Encounter (Signed)
Patient requests to be called regarding Patient requests a written RX for PARoxetine (PAXIL) 20 MG tablet   (or samples if available) so that he can pick it up-needs medication asap.

## 2023-02-14 NOTE — Telephone Encounter (Signed)
Patient states he is out of the following RX and requests RX for a 14 day supply of PARoxetine (PAXIL) 20 MG tablet  [facility]ap [facility]   [id number] [place] [phone] Fax: [phone]

## 2023-02-17 ENCOUNTER — Encounter: Payer: Self-pay | Admitting: Family Medicine

## 2023-02-18 ENCOUNTER — Encounter: Payer: Self-pay | Admitting: Family Medicine

## 2023-02-20 MED ORDER — CLONAZEPAM 0.5 MG PO TABS: 0.5000 mg | ORAL_TABLET | Freq: Every day | ORAL | 1 refills | Status: DC

## 2023-02-20 NOTE — Addendum Note (Signed)
Addended by: Billey Chang on: 02/20/2023 11:07 AM   Modules accepted: Orders

## 2023-02-21 ENCOUNTER — Other Ambulatory Visit: Payer: Self-pay

## 2023-02-21 MED ORDER — PAROXETINE HCL 20 MG PO TABS: 20.0000 mg | ORAL_TABLET | Freq: Every day | ORAL | 0 refills | Status: DC

## 2023-02-21 NOTE — Telephone Encounter (Signed)
Patient states:  - He is still in need of paxil 14 day supply due to [facility] not [facility] regular quantity yet  - He is out of medication  Patient requests [facility] on [address] in [place].

## 2023-03-01 ENCOUNTER — Ambulatory Visit: Payer: BC Managed Care – PPO | Admitting: Behavioral Health

## 2023-03-08 DIAGNOSIS — L299 Pruritus, unspecified: Secondary | ICD-10-CM | POA: Diagnosis not present

## 2023-03-08 DIAGNOSIS — R21 Rash and other nonspecific skin eruption: Secondary | ICD-10-CM | POA: Diagnosis not present

## 2023-03-08 DIAGNOSIS — Z202 Contact with and (suspected) exposure to infections with a predominantly sexual mode of transmission: Secondary | ICD-10-CM | POA: Diagnosis not present

## 2023-04-25 DIAGNOSIS — E291 Testicular hypofunction: Secondary | ICD-10-CM | POA: Diagnosis not present

## 2023-04-25 DIAGNOSIS — Z79890 Hormone replacement therapy (postmenopausal): Secondary | ICD-10-CM | POA: Diagnosis not present

## 2023-04-27 DIAGNOSIS — Z6835 Body mass index (BMI) 35.0-35.9, adult: Secondary | ICD-10-CM | POA: Diagnosis not present

## 2023-04-27 DIAGNOSIS — E291 Testicular hypofunction: Secondary | ICD-10-CM | POA: Diagnosis not present

## 2023-04-27 DIAGNOSIS — F419 Anxiety disorder, unspecified: Secondary | ICD-10-CM | POA: Diagnosis not present

## 2023-04-27 DIAGNOSIS — G479 Sleep disorder, unspecified: Secondary | ICD-10-CM | POA: Diagnosis not present

## 2023-04-28 ENCOUNTER — Encounter: Payer: Self-pay | Admitting: Family Medicine

## 2023-04-28 ENCOUNTER — Ambulatory Visit: Payer: BC Managed Care – PPO | Admitting: Family Medicine

## 2023-04-28 VITALS — BP 134/84 | HR 70 | Temp 98.7°F | Ht 67.0 in | Wt 228.2 lb

## 2023-04-28 DIAGNOSIS — F4321 Adjustment disorder with depressed mood: Secondary | ICD-10-CM

## 2023-04-28 DIAGNOSIS — F411 Generalized anxiety disorder: Secondary | ICD-10-CM

## 2023-04-28 DIAGNOSIS — F4323 Adjustment disorder with mixed anxiety and depressed mood: Secondary | ICD-10-CM

## 2023-04-28 MED ORDER — ALPRAZOLAM 0.5 MG PO TABS: 0.5000 mg | ORAL_TABLET | Freq: Every day | ORAL | 0 refills | Status: DC | PRN

## 2023-04-28 NOTE — Progress Notes (Signed)
Subjective  CC:  Chief Complaint  Patient presents with   Family Problem    Pt stated that he has been going thru a grievance process sine losing his father on 4/3/204    HPI: Shannon Ibarra is a 47 y.o. male who presents to the office today to address the problems listed above in the chief complaint, mood problems. Shannon Ibarra's father passed away early last month after a prolonged illness, due to throat cancer.  He was his primary caretaker for several months prior to his passing.  His father lived in [place] and he has been traveling back and forth.  Life has become complicated for him.  Marital discord, thoughts of divorce, and an extramarital relationship developed in [place].  He comes in today due to feeling quite stressed.  His anxiety is very high, anger management has become an issue again and he is not certain about what he needs to do.  He has used Xanax for these types of symptoms in the past and it has worked well.  He has followed with psychiatry and remains on Paxil 20 mg daily which he states works very well.  Intermittently he will also use Klonopin.  However due to his current state of mind, he feels Xanax would be a better choice for him.  Dr. Dub Mikes is no longer in practice.  He would be open to seeing a psychiatrist again.  He denies chest pain, suicidal ideation.     04/28/2023    9:22 AM 11/29/2022    2:10 PM 10/19/2021    2:00 PM  Depression screen PHQ 2/9  Decreased Interest 2 0 0  Down, Depressed, Hopeless 1 0 0  PHQ - 2 Score 3 0 0  Altered sleeping 3    Tired, decreased energy 0    Change in appetite 1    Feeling bad or failure about yourself  0    Trouble concentrating 0    Moving slowly or fidgety/restless 0    Suicidal thoughts 0    PHQ-9 Score 7    Difficult doing work/chores Somewhat difficult        04/28/2023    9:24 AM 06/21/2019    9:50 AM 01/10/2018   10:19 AM  GAD 7 : Generalized Anxiety Score  Nervous, Anxious, on Edge 3 0 0  Control/stop  worrying 1 0 0  Worry too much - different things 1 0 0  Trouble relaxing 1 0 0  Restless 0 0 0  Easily annoyed or irritable 1 0 0  Afraid - awful might happen 0 0 0  Total GAD 7 Score 7 0 0  Anxiety Difficulty Somewhat difficult Not difficult at all Extremely difficult     Assessment  1. Grief   2. GAD (generalized anxiety disorder)   3. Adjustment reaction with anxiety and depression      Plan  Depression: Grief and anxiety and depression: Recommend hospice for counseling, recommend psychotherapy to work through his current relationship issues.  Will use Xanax once daily as needed for acute stress/anxiety/anger issues.  Continue Paxil 20 mg daily.  Will see how he does.  He may need a new psychiatrist in the future.  Counseling done Reviewed concept of mood problems caused by biochemical imbalance of neurotransmitters and rationale for treatment with medications and therapy.  Counseling given: pt was instructed to contact office, on-call physician or crisis Hotline if symptoms worsen significantly. If patient develops any suicidal or homicidal thoughts, he is directed  to the ER immediately.   Follow up: As needed No orders of the defined types were placed in this encounter.  Meds ordered this encounter  Medications   ALPRAZolam (XANAX) 0.5 MG tablet    Sig: Take 1 tablet (0.5 mg total) by mouth daily as needed for anxiety.    Dispense:  30 tablet    Refill:  0      I reviewed the patients updated PMH, FH, and SocHx.    Patient Active Problem List   Diagnosis Date Noted   GAD (generalized anxiety disorder) 01/10/2018    Priority: High   Vestibular nerve damage 01/10/2018    Priority: High   Obstructive sleep apnea syndrome, severe 10/19/2016    Priority: High   Mixed hyperlipidemia 05/10/2008    Priority: High   Hepatic steatosis 10/19/2021    Priority: Medium    Irritable bowel syndrome with diarrhea 05/04/2018    Priority: Medium    Nonulcer dyspepsia 01/10/2018     Priority: Medium    Hypogonadotropic hypogonadism in male [facility]) 02/19/2014    Priority: Medium    Acne rosacea 06/07/2014    Priority: Low   Drug-induced gynecomastia 02/19/2014    Priority: Low   Allergic rhinitis 05/10/2008    Priority: Low   Obesity (BMI 30-39.9) 11/29/2022   Nicotine dependence, cigarettes, uncomplicated 11/29/2022   Non-cardiac chest pain 01/10/2018   Current Meds  Medication Sig   ALPRAZolam (XANAX) 0.5 MG tablet Take 1 tablet (0.5 mg total) by mouth daily as needed for anxiety.    Allergies: Patient is allergic to nitroglycerin, bupropion, and dust mite extract. Family history:  Patient family history includes Colon polyps in his father; Hypertension in his brother and mother; Throat cancer in his father. Social History   Socioeconomic History   Marital status: Married    Spouse name: Not on file   Number of children: 1   Years of education: Not on file   Highest education level: Not on file  Occupational History   Occupation: management    Employer: [employer]  Tobacco Use   Smoking status: Former    Packs/day: 1.00    Years: 12.00    Additional pack years: 0.00    Total pack years: 12.00    Types: Cigarettes    Quit date: 03/08/2007    Years since quitting: 16.1   Smokeless tobacco: Never   Tobacco comments:    quit again in 2022  Vaping Use   Vaping Use: Never used  Substance and Sexual Activity   Alcohol use: Yes    Alcohol/week: 2.0 standard drinks of alcohol    Types: 1 Glasses of wine, 1 Cans of beer per week   Drug use: No   Sexual activity: Yes    Partners: Female  Other Topics Concern   Not on file  Social History Narrative   Originally from [place]   Social Determinants of Health   Financial Resource Strain: Not on file  Food Insecurity: Not on file  Transportation Needs: Not on file  Physical Activity: Not on file  Stress: Not on file  Social Connections: Not on file     Review of Systems: Constitutional: Negative for  fever malaise or anorexia Cardiovascular: negative for chest pain Respiratory: negative for SOB or persistent cough Gastrointestinal: negative for abdominal pain  Objective  Vitals: BP 134/84   Pulse 70   Temp 98.7 F (37.1 C)   Ht 5\' 7"  (1.702 m)   Wt 228 lb 3.2  oz (103.5 kg)   SpO2 98%   BMI 35.74 kg/m  General: no acute distress, well appearing, no apparent distress, well groomed Psych:  Alert and oriented x 3,normal mood, behavior, speech, dress, and thought processes.     Commons side effects, risks, benefits, and alternatives for medications and treatment plan prescribed today were discussed, and the patient expressed understanding of the given instructions. Patient is instructed to call or message via MyChart if he/she has any questions or concerns regarding our treatment plan. No barriers to understanding were identified. We discussed Red Flag symptoms and signs in detail. Patient expressed understanding regarding what to do in case of urgent or emergency type symptoms.  Medication list was reconciled, printed and provided to the patient in AVS. Patient instructions and summary information was reviewed with the patient as documented in the AVS. This note was prepared with assistance of Dragon voice recognition software. Occasional wrong-word or sound-a-like substitutions may have occurred due to the inherent limitations of voice recognition software

## 2023-04-28 NOTE — Patient Instructions (Signed)
Please follow up if symptoms do not improve or as needed.    Please call [facility] to schedule an appointment with Dr. Hollie Salk; she is a therapist here at our [place] office.  The phone number is: [phone]

## 2023-05-03 ENCOUNTER — Encounter: Payer: Self-pay | Admitting: Family Medicine

## 2023-05-04 ENCOUNTER — Other Ambulatory Visit: Payer: Self-pay | Admitting: Family Medicine

## 2023-05-04 ENCOUNTER — Encounter: Payer: Self-pay | Admitting: Family Medicine

## 2023-05-05 ENCOUNTER — Other Ambulatory Visit: Payer: Self-pay

## 2023-05-05 MED ORDER — KETOCONAZOLE 2 % EX SHAM: MEDICATED_SHAMPOO | CUTANEOUS | 3 refills | Status: DC

## 2023-05-10 ENCOUNTER — Ambulatory Visit (INDEPENDENT_AMBULATORY_CARE_PROVIDER_SITE_OTHER): Payer: BC Managed Care – PPO | Admitting: Behavioral Health

## 2023-05-10 DIAGNOSIS — F411 Generalized anxiety disorder: Secondary | ICD-10-CM | POA: Diagnosis not present

## 2023-05-10 NOTE — Progress Notes (Signed)
                Shannon Ibarra L Kyson Kupper, LMFT 

## 2023-05-10 NOTE — Progress Notes (Signed)
[facility] Counselor Initial Adult Exam  Name: Shannon Ibarra Date: 05/10/2023 MRN: [id number] DOB: 1976/04/03 PCP: Shannon Ora, MD  Time spent: 60 min In person @ [facility]  Guardian/Payee:  Self; BCBS Comm PPO    Paperwork requested: No   Reason for Visit /Presenting Problem: Elevated anx/dep & stress due to needed conversation w/his 48yo Dtr Shannon Ibarra about his move out of the home & disclosing plan to divorce Wife/Mother Shannon Ibarra  Mental Status Exam: Appearance:   Neat and Well Groomed     Behavior:  Appropriate and Sharing  Motor:  Normal  Speech/Language:   Clear and Coherent and Normal Rate  Affect:  Appropriate  Mood:  anxious  Thought process:  normal  Thought content:    WNL  Sensory/Perceptual disturbances:    WNL  Orientation:  oriented to person, place, and time/date  Attention:  Good  Concentration:  Good  Memory:  WNL  Fund of knowledge:   Good  Insight:    Good  Judgment:   Good  Impulse Control:  Good    Risk Assessment: Danger to Self:  No Self-injurious Behavior: No Danger to Others: No Duty to Warn:no Physical Aggression / Violence:No  Access to Firearms a concern: No  Gang Involvement:No  Patient / guardian was educated about steps to take if suicide or homicide risk level increases between visits: yes; appropriate to ICD process While future psychiatric events cannot be accurately predicted, the patient does not currently require acute inpatient psychiatric care and does not currently meet [facility] involuntary commitment criteria.  Substance Abuse History: Current substance abuse: No     Past Psychiatric History:   No previous psychological problems have been observed Outpatient Providers: Dr. Asencion Partridge, MD History of Psych Hospitalization: No  Psychological Testing:  NA    Abuse History:  Victim of: No.,  NA    Report needed: No. Victim of Neglect:No. Perpetrator of  NA   Witness / Exposure to Domestic  Violence: No   Protective Services Involvement: No  Witness to [employer] Violence:  No   Family History:  Family History  Problem Relation Age of Onset   Hypertension Mother    Throat cancer Father    Colon polyps Father    Hypertension Brother     Living situation: the patient lives with their family  Sexual Orientation: Straight  Relationship Status: married  Name of spouse / other: Shannon Ibarra If a parent, number of children / ages:48yo Dtr Personal assistant Systems: friends Siblings  Financial Stress:  No   Income/Employment/Disability: 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 in [place] named [place]  Any cultural differences that may affect / interfere with treatment:  None noted today  Recreation/Hobbies: Unk  Stressors: Loss of marital strength & his notice to Wife this Clovis Cao he is moving out   Marital or family conflict   Other: Pt did this 9 yrs ago & his Wife did not have a good rxn; she cried & screamed & chased him around the house w/his young Dtr in the house    Strengths: Supportive Relationships, Family, Friends, Warehouse manager, Spirituality, Hopefulness, Journalist, newspaper, and Able to Communicate Effectively  Barriers:  None noted today; Pt c/o prior therapy that was not helpful. Good impression of Clinician, Pt sts today. He likes honesty & forthrightness w/a respect for  his faith syst.   Legal History: Pending legal issue / charges: The patient has no significant history of legal issues. History of legal issue / charges:  NA  Medical History/Surgical History: reviewed Past Medical History:  Diagnosis Date   Acne    Allergic rhinitis    Allergist in [place]   Anxiety    Fatty liver    Hyperlipidemia 11/[id number]   Irritable bowel syndrome with diarrhea 05/04/2018   Nonulcer dyspepsia 01/10/2018    Rosacea, acne 06/07/2014   Vestibular nerve damage 01/10/2018   Acute neuritis, labyrinthitis 09/2017; [facility] eval.    Vestibular neuritis 10/13/2017    Past Surgical History:  Procedure Laterality Date   TONSILLECTOMY      Medications: Current Outpatient Medications  Medication Sig Dispense Refill   ALPRAZolam (XANAX) 0.5 MG tablet Take 1 tablet (0.5 mg total) by mouth daily as needed for anxiety. 30 tablet 0   anastrozole (ARIMIDEX) 1 MG tablet SMARTSIG:0.5 Tablet(s) By Mouth Once a Week     cetirizine (ZYRTEC) 10 MG tablet Take 10 mg by mouth daily.     cholecalciferol (VITAMIN D3) 25 MCG (1000 UNIT) tablet Take 1,000 Units by mouth daily.     clonazePAM (KLONOPIN) 0.5 MG tablet Take 1 tablet (0.5 mg total) by mouth daily. 90 tablet 1   esomeprazole (NEXIUM) 20 MG capsule Take 1 capsule (20 mg total) by mouth daily.     ketoconazole (NIZORAL) 2 % shampoo Apply topically 2 (two) times a week. As needed 120 mL 3   minocycline (MINOCIN) 100 MG capsule Take 1 capsule (100 mg total) by mouth 2 (two) times daily. 180 capsule 3   nicotine (NICOTROL) 10 MG inhaler Inhale 1 Cartridge (1 continuous puffing total) into the lungs as needed for smoking cessation. Rec 6-16/day; max is 16/day 168 each 5   olopatadine (PATANOL) 0.1 % ophthalmic solution Place 1 drop into both eyes 2 (two) times daily. 5 mL 12   Omega-3 Fatty Acids (FISH OIL) 1000 MG CAPS Take 1 capsule (1,000 mg total) by mouth 2 (two) times daily.  0   PARoxetine (PAXIL) 20 MG tablet Take 1 tablet (20 mg total) by mouth daily. 14 tablet 0   simvastatin (ZOCOR) 20 MG tablet TAKE 1 TABLET AT BEDTIME 90 tablet 3   testosterone cypionate (DEPOTESTOSTERONE CYPIONATE) 200 MG/ML injection Inject into the muscle.     valACYclovir (VALTREX) 1000 MG tablet Take 2 tablets (2,000 mg total) by mouth 2 (two) times daily. Once, as needed for cold sores 20 tablet 0   No current facility-administered medications for this visit.    Allergies   Allergen Reactions   Nitroglycerin Other (See Comments)    Severe bradycardia   Bupropion Other (See Comments)   Dust Mite Extract Rash    Diagnoses:  GAD (generalized anxiety disorder)  Plan of Care: Shannon Ibarra will attend all scheduled sessions every 3-4 wks. He will utilize the tools/resources provided to enhance his discussion w/his Wife & Dtr separately, ensuring his conversation about divorce is private w/his Wife. This will ensure he can plan to speak w/his Dtr alone & be fully present for her to prevent any traumatic occurrence.  Target Date: Wed., 06/01/2023 @ 4pm  Progress: 6  Frequency: Once every 3-4 wks  Modality: Claretta Fraise, LMFT

## 2023-05-28 ENCOUNTER — Encounter: Payer: Self-pay | Admitting: Family Medicine

## 2023-05-31 MED ORDER — CLONAZEPAM 0.25 MG PO TBDP: 0.2500 mg | ORAL_TABLET | Freq: Every day | ORAL | 1 refills | Status: DC | PRN

## 2023-05-31 NOTE — Telephone Encounter (Signed)
Please see pt message for refill request. Already removed [facility] from pharmacy on file as requested by patient.

## 2023-06-01 ENCOUNTER — Ambulatory Visit: Payer: BC Managed Care – PPO | Admitting: Behavioral Health

## 2023-06-07 DIAGNOSIS — G4733 Obstructive sleep apnea (adult) (pediatric): Secondary | ICD-10-CM | POA: Diagnosis not present

## 2023-06-07 DIAGNOSIS — J3089 Other allergic rhinitis: Secondary | ICD-10-CM | POA: Diagnosis not present

## 2023-06-07 DIAGNOSIS — E6609 Other obesity due to excess calories: Secondary | ICD-10-CM | POA: Diagnosis not present

## 2023-06-07 DIAGNOSIS — Z6834 Body mass index (BMI) 34.0-34.9, adult: Secondary | ICD-10-CM | POA: Diagnosis not present

## 2023-06-20 ENCOUNTER — Encounter: Payer: Self-pay | Admitting: Family Medicine

## 2023-06-20 ENCOUNTER — Ambulatory Visit (INDEPENDENT_AMBULATORY_CARE_PROVIDER_SITE_OTHER): Payer: BC Managed Care – PPO | Admitting: Family Medicine

## 2023-06-20 ENCOUNTER — Other Ambulatory Visit (HOSPITAL_COMMUNITY)
Admission: RE | Admit: 2023-06-20 | Discharge: 2023-06-20 | Disposition: A | Discharge status: Home / Self Care | Payer: BC Managed Care – PPO | Source: Ambulatory Visit | Attending: Family Medicine | Admitting: Family Medicine

## 2023-06-20 VITALS — BP 138/80 | HR 72 | Temp 98.2°F | Ht 67.0 in | Wt 214.2 lb

## 2023-06-20 DIAGNOSIS — B372 Candidiasis of skin and nail: Secondary | ICD-10-CM

## 2023-06-20 DIAGNOSIS — H6122 Impacted cerumen, left ear: Secondary | ICD-10-CM

## 2023-06-20 DIAGNOSIS — Z113 Encounter for screening for infections with a predominantly sexual mode of transmission: Secondary | ICD-10-CM | POA: Diagnosis not present

## 2023-06-20 NOTE — Patient Instructions (Signed)
Please return in December for your annual complete physical; please come fasting.   If you have any questions or concerns, please don't hesitate to send me a message via MyChart or call the office at [phone]. Thank you for visiting with us today! It's our pleasure caring for you.   

## 2023-06-20 NOTE — Progress Notes (Signed)
Subjective  CC:  Chief Complaint  Patient presents with   std testing    ear pain    Pt stated thathe thinks that he has swimmers esr     HPI: Shannon Ibarra is a 47 y.o. male who presents to the office today to address the problems listed above in the chief complaint. Complains of itchy yeast infection on shaft of penis.  Using prescription cream given to him for past infection.  It is helpful.  He denies ulcerations, sores or penile discharge.  He has had unprotected sex and would like to have STD screens.  Currently separated from his wife, living on his own.  Feels happy. Complain of fullness in his left ear.  Sometimes cannot hear out of it.  No pain.  Assessment  1. Screen for STD (sexually transmitted disease)   2. Yeast infection of the skin   3. Impacted cerumen of left ear      Plan  High risk sexual activity, STD screening and yeast infection: Recommend condoms.  Complete 1 week of treatment for yeast infection.  STD screening discussed and ordered Impacted cerumen of left ear cleared with irrigation.  Follow up: As needed, December for complete physical Visit date not found  Orders Placed This Encounter  Procedures   HIV Antibody (routine testing w rflx)   RPR   No orders of the defined types were placed in this encounter.     I reviewed the patients updated PMH, FH, and SocHx.    Patient Active Problem List   Diagnosis Date Noted   GAD (generalized anxiety disorder) 01/10/2018    Priority: High   Vestibular nerve damage 01/10/2018    Priority: High   Obstructive sleep apnea syndrome, severe 10/19/2016    Priority: High   Mixed hyperlipidemia 05/10/2008    Priority: High   Hepatic steatosis 10/19/2021    Priority: Medium    Irritable bowel syndrome with diarrhea 05/04/2018    Priority: Medium    Nonulcer dyspepsia 01/10/2018    Priority: Medium    Hypogonadotropic hypogonadism in male [facility]) 02/19/2014    Priority: Medium    Acne rosacea  06/07/2014    Priority: Low   Drug-induced gynecomastia 02/19/2014    Priority: Low   Allergic rhinitis 05/10/2008    Priority: Low   Obesity (BMI 30-39.9) 11/29/2022   Nicotine dependence, cigarettes, uncomplicated 11/29/2022   Non-cardiac chest pain 01/10/2018   Current Meds  Medication Sig   ALPRAZolam (XANAX) 0.5 MG tablet Take 1 tablet (0.5 mg total) by mouth daily as needed for anxiety.   anastrozole (ARIMIDEX) 1 MG tablet SMARTSIG:0.5 Tablet(s) By Mouth Once a Week   cetirizine (ZYRTEC) 10 MG tablet Take 10 mg by mouth daily.   cholecalciferol (VITAMIN D3) 25 MCG (1000 UNIT) tablet Take 1,000 Units by mouth daily.   clonazePAM (KLONOPIN) 0.25 MG disintegrating tablet Take 1 tablet (0.25 mg total) by mouth daily as needed for seizure.   clonazePAM (KLONOPIN) 0.5 MG tablet Take 1 tablet (0.5 mg total) by mouth daily.   esomeprazole (NEXIUM) 20 MG capsule Take 1 capsule (20 mg total) by mouth daily.   ketoconazole (NIZORAL) 2 % shampoo Apply topically 2 (two) times a week. As needed   minocycline (MINOCIN) 100 MG capsule Take 1 capsule (100 mg total) by mouth 2 (two) times daily.   nicotine (NICOTROL) 10 MG inhaler Inhale 1 Cartridge (1 continuous puffing total) into the lungs as needed for smoking cessation. Rec 6-16/day; max  is 16/day   olopatadine (PATANOL) 0.1 % ophthalmic solution Place 1 drop into both eyes 2 (two) times daily.   Omega-3 Fatty Acids (FISH OIL) 1000 MG CAPS Take 1 capsule (1,000 mg total) by mouth 2 (two) times daily.   PARoxetine (PAXIL) 20 MG tablet Take 1 tablet (20 mg total) by mouth daily.   simvastatin (ZOCOR) 20 MG tablet TAKE 1 TABLET AT BEDTIME   testosterone cypionate (DEPOTESTOSTERONE CYPIONATE) 200 MG/ML injection Inject into the muscle.   valACYclovir (VALTREX) 1000 MG tablet Take 2 tablets (2,000 mg total) by mouth 2 (two) times daily. Once, as needed for cold sores    Allergies: Patient is allergic to nitroglycerin, bupropion, and dust mite  extract. Family History: Patient family history includes Colon polyps in his father; Hypertension in his brother and mother; Throat cancer in his father. Social History:  Patient  reports that he quit smoking about 16 years ago. His smoking use included cigarettes. He has a 12.00 pack-year smoking history. He has never used smokeless tobacco. He reports current alcohol use of about 2.0 standard drinks of alcohol per week. He reports that he does not use drugs.  Review of Systems: Constitutional: Negative for fever malaise or anorexia Cardiovascular: negative for chest pain Respiratory: negative for SOB or persistent cough Gastrointestinal: negative for abdominal pain  Objective  Vitals: BP 138/80   Pulse 72   Temp 98.2 F (36.8 C)   Ht 5\' 7"  (1.702 m)   Wt 214 lb 3.2 oz (97.2 kg)   SpO2 98%   BMI 33.55 kg/m  General: no acute distress , A&Ox3 HEENT: PEERL, conjunctiva normal, neck is supple, left cerumen impaction, clear TM after irrigation  Commons side effects, risks, benefits, and alternatives for medications and treatment plan prescribed today were discussed, and the patient expressed understanding of the given instructions. Patient is instructed to call or message via MyChart if he/she has any questions or concerns regarding our treatment plan. No barriers to understanding were identified. We discussed Red Flag symptoms and signs in detail. Patient expressed understanding regarding what to do in case of urgent or emergency type symptoms.  Medication list was reconciled, printed and provided to the patient in AVS. Patient instructions and summary information was reviewed with the patient as documented in the AVS. This note was prepared with assistance of Dragon voice recognition software. Occasional wrong-word or sound-a-like substitutions may have occurred due to the inherent limitations of voice recognition software

## 2023-06-21 LAB — HIV ANTIBODY (ROUTINE TESTING W REFLEX): HIV 1&2 Ab, 4th Generation: NONREACTIVE

## 2023-06-21 LAB — RPR: RPR Ser Ql: NONREACTIVE

## 2023-06-22 LAB — URINE CYTOLOGY ANCILLARY ONLY
Chlamydia: NEGATIVE
Comment: NEGATIVE
Comment: NORMAL
Neisseria Gonorrhea: NEGATIVE

## 2023-06-22 NOTE — Progress Notes (Signed)
See my chart note.

## 2023-07-07 ENCOUNTER — Encounter: Payer: Self-pay | Admitting: Family Medicine

## 2023-07-13 ENCOUNTER — Encounter: Payer: Self-pay | Admitting: Family Medicine

## 2023-07-14 NOTE — Telephone Encounter (Signed)
Not a disability; cannot fill out form. Pt notified

## 2023-07-26 DIAGNOSIS — Z79890 Hormone replacement therapy (postmenopausal): Secondary | ICD-10-CM | POA: Diagnosis not present

## 2023-07-26 DIAGNOSIS — E291 Testicular hypofunction: Secondary | ICD-10-CM | POA: Diagnosis not present

## 2023-07-29 DIAGNOSIS — E291 Testicular hypofunction: Secondary | ICD-10-CM | POA: Diagnosis not present

## 2023-07-29 DIAGNOSIS — Z6833 Body mass index (BMI) 33.0-33.9, adult: Secondary | ICD-10-CM | POA: Diagnosis not present

## 2023-07-29 DIAGNOSIS — G479 Sleep disorder, unspecified: Secondary | ICD-10-CM | POA: Diagnosis not present

## 2023-07-29 DIAGNOSIS — F419 Anxiety disorder, unspecified: Secondary | ICD-10-CM | POA: Diagnosis not present

## 2023-09-07 DIAGNOSIS — Z20822 Contact with and (suspected) exposure to covid-19: Secondary | ICD-10-CM | POA: Diagnosis not present

## 2023-09-07 DIAGNOSIS — U071 COVID-19: Secondary | ICD-10-CM | POA: Diagnosis not present

## 2023-09-09 DIAGNOSIS — G4733 Obstructive sleep apnea (adult) (pediatric): Secondary | ICD-10-CM | POA: Diagnosis not present

## 2023-09-22 ENCOUNTER — Encounter: Payer: Self-pay | Admitting: Family Medicine

## 2023-09-22 MED ORDER — CLONAZEPAM 0.5 MG PO TABS: 0.5000 mg | ORAL_TABLET | Freq: Every day | ORAL | 1 refills | Status: DC

## 2023-10-09 DIAGNOSIS — G4733 Obstructive sleep apnea (adult) (pediatric): Secondary | ICD-10-CM | POA: Diagnosis not present

## 2023-10-24 ENCOUNTER — Other Ambulatory Visit: Payer: Self-pay | Admitting: Family Medicine

## 2023-10-24 ENCOUNTER — Ambulatory Visit: Payer: BC Managed Care – PPO | Admitting: Family Medicine

## 2023-10-24 ENCOUNTER — Encounter: Payer: Self-pay | Admitting: Family Medicine

## 2023-10-24 VITALS — BP 110/62 | HR 70 | Temp 98.6°F | Ht 67.0 in | Wt 214.0 lb

## 2023-10-24 DIAGNOSIS — J029 Acute pharyngitis, unspecified: Secondary | ICD-10-CM

## 2023-10-24 DIAGNOSIS — U071 COVID-19: Secondary | ICD-10-CM

## 2023-10-24 LAB — POCT INFLUENZA A/B
Influenza A, POC: NEGATIVE
Influenza B, POC: NEGATIVE

## 2023-10-24 LAB — POC COVID19 BINAXNOW: SARS Coronavirus 2 Ag: POSITIVE — AB

## 2023-10-24 MED ORDER — NIRMATRELVIR/RITONAVIR (PAXLOVID)TABLET: 3.0000 | ORAL_TABLET | Freq: Two times a day (BID) | ORAL | 0 refills | Status: DC

## 2023-10-24 NOTE — Progress Notes (Signed)
Subjective  CC:  Chief Complaint  Patient presents with   Sore Throat    Pt stated that his throat has been hurting for the past 2 days along with chills    Chills    HPI: Shannon Ibarra is a 47 y.o. male who presents to the office today to address the problems listed above in the chief complaint. C/o sore throat, fatigue and chills/low grade fevers.. No SOB or GI sxs. No known exposure ot strep or mono. Was in [place] for [employer] concert with daughter over weekend. OTC analgesics have been used with minimal or mild relief. Eating and drinking OK.   I reviewed the patients updated PMH, FH, and SocHx.    Patient Active Problem List   Diagnosis Date Noted   GAD (generalized anxiety disorder) 01/10/2018    Priority: High   Vestibular nerve damage 01/10/2018    Priority: High   Obstructive sleep apnea syndrome, severe 10/19/2016    Priority: High   Mixed hyperlipidemia 05/10/2008    Priority: High   Hepatic steatosis 10/19/2021    Priority: Medium    Irritable bowel syndrome with diarrhea 05/04/2018    Priority: Medium    Nonulcer dyspepsia 01/10/2018    Priority: Medium    Hypogonadotropic hypogonadism in male [facility]) 02/19/2014    Priority: Medium    Acne rosacea 06/07/2014    Priority: Low   Drug-induced gynecomastia 02/19/2014    Priority: Low   Allergic rhinitis 05/10/2008    Priority: Low   Obesity (BMI 30-39.9) 11/29/2022   Nicotine dependence, cigarettes, uncomplicated 11/29/2022   Non-cardiac chest pain 01/10/2018   Current Meds  Medication Sig   ALPRAZolam (XANAX) 0.5 MG tablet Take 1 tablet (0.5 mg total) by mouth daily as needed for anxiety.   anastrozole (ARIMIDEX) 1 MG tablet SMARTSIG:0.5 Tablet(s) By Mouth Once a Week   cetirizine (ZYRTEC) 10 MG tablet Take 10 mg by mouth daily.   cholecalciferol (VITAMIN D3) 25 MCG (1000 UNIT) tablet Take 1,000 Units by mouth daily.   clonazePAM (KLONOPIN) 0.5 MG tablet Take 1 tablet (0.5 mg total) by mouth daily.    esomeprazole (NEXIUM) 20 MG capsule Take 1 capsule (20 mg total) by mouth daily.   ketoconazole (NIZORAL) 2 % shampoo Apply topically 2 (two) times a week. As needed   minocycline (MINOCIN) 100 MG capsule Take 1 capsule (100 mg total) by mouth 2 (two) times daily.   nicotine (NICOTROL) 10 MG inhaler Inhale 1 Cartridge (1 continuous puffing total) into the lungs as needed for smoking cessation. Rec 6-16/day; max is 16/day   nirmatrelvir/ritonavir (PAXLOVID) 20 x 150 MG & 10 x 100MG  TABS Take 3 tablets by mouth 2 (two) times daily for 5 days. (Take nirmatrelvir 150 mg two tablets twice daily for 5 days and ritonavir 100 mg one tablet twice daily for 5 days) Patient GFR is 96   olopatadine (PATANOL) 0.1 % ophthalmic solution Place 1 drop into both eyes 2 (two) times daily.   Omega-3 Fatty Acids (FISH OIL) 1000 MG CAPS Take 1 capsule (1,000 mg total) by mouth 2 (two) times daily.   PARoxetine (PAXIL) 20 MG tablet Take 1 tablet (20 mg total) by mouth daily.   simvastatin (ZOCOR) 20 MG tablet TAKE 1 TABLET AT BEDTIME   testosterone cypionate (DEPOTESTOSTERONE CYPIONATE) 200 MG/ML injection Inject into the muscle.   valACYclovir (VALTREX) 1000 MG tablet Take 2 tablets (2,000 mg total) by mouth 2 (two) times daily. Once, as needed for cold  sores    Allergies: Patient is allergic to nitroglycerin, bupropion, and dust mite extract.  Review of Systems: Constitutional: Negative for fever malaise or anorexia Cardiovascular: negative for chest pain Respiratory: negative for SOB or persistent cough Gastrointestinal: negative for abdominal pain  Objective  Vitals: BP 110/62   Pulse 70   Temp 98.6 F (37 C)   Ht 5\' 7"  (1.702 m)   Wt 214 lb (97.1 kg)   SpO2 95%   BMI 33.52 kg/m  General: no acute distress , A&Ox3 Appears well  Office Visit on 10/24/2023  Component Date Value Ref Range Status   Influenza A, POC 10/24/2023 Negative  Negative Final   Influenza B, POC 10/24/2023 Negative  Negative  Final   SARS Coronavirus 2 Ag 10/24/2023 Positive (A)  Negative Final    Assessment  1. COVID-19   2. Sore throat      Plan  Supportive care with advil, tylenol, gargles etc discussed. Paxlovid and pt will hold simvastatin. RTO if sxs persist or worsen.   Follow up: as needed  Commons side effects, risks, benefits, and alternatives for medications and treatment plan prescribed today were discussed, and the patient expressed understanding of the given instructions. Patient is instructed to call or message via MyChart if he/she has any questions or concerns regarding our treatment plan. No barriers to understanding were identified. We discussed Red Flag symptoms and signs in detail. Patient expressed understanding regarding what to do in case of urgent or emergency type symptoms.  Medication list was reconciled, printed and provided to the patient in AVS. Patient instructions and summary information was reviewed with the patient as documented in the AVS. This note was prepared with assistance of Dragon voice recognition software. Occasional wrong-word or sound-a-like substitutions may have occurred due to the inherent limitations of voice recognition software  Orders Placed This Encounter  Procedures   POCT Influenza A/B   POC COVID-19   Meds ordered this encounter  Medications   nirmatrelvir/ritonavir (PAXLOVID) 20 x 150 MG & 10 x 100MG  TABS    Sig: Take 3 tablets by mouth 2 (two) times daily for 5 days. (Take nirmatrelvir 150 mg two tablets twice daily for 5 days and ritonavir 100 mg one tablet twice daily for 5 days) Patient GFR is 96    Dispense:  30 tablet    Refill:  0

## 2023-10-24 NOTE — Patient Instructions (Signed)
Please follow up as scheduled for your next visit with me: 12/15/2023   If you have any questions or concerns, please don't hesitate to send me a message via MyChart or call the office at [phone]. Thank you for visiting with [place] today! It's our pleasure caring for you.

## 2023-10-25 ENCOUNTER — Telehealth: Payer: Self-pay

## 2023-10-25 MED ORDER — MOLNUPIRAVIR EUA 200MG CAPSULE: 4.0000 | ORAL_CAPSULE | Freq: Two times a day (BID) | ORAL | 0 refills | Status: AC

## 2023-10-25 NOTE — Telephone Encounter (Signed)
New med sent in. See mychart note. thanks

## 2023-10-25 NOTE — Telephone Encounter (Signed)
Spoke with pt and he stated that his insurance will not cover the Paxlovid but will need the alternative Lagevrio instead.

## 2023-10-25 NOTE — Telephone Encounter (Signed)
Patient called for update on previous message

## 2023-11-04 ENCOUNTER — Encounter: Payer: Self-pay | Admitting: Family Medicine

## 2023-11-07 DIAGNOSIS — Z79890 Hormone replacement therapy (postmenopausal): Secondary | ICD-10-CM | POA: Diagnosis not present

## 2023-11-07 DIAGNOSIS — E291 Testicular hypofunction: Secondary | ICD-10-CM | POA: Diagnosis not present

## 2023-11-07 DIAGNOSIS — R768 Other specified abnormal immunological findings in serum: Secondary | ICD-10-CM | POA: Diagnosis not present

## 2023-11-09 DIAGNOSIS — E291 Testicular hypofunction: Secondary | ICD-10-CM | POA: Diagnosis not present

## 2023-11-09 DIAGNOSIS — G4733 Obstructive sleep apnea (adult) (pediatric): Secondary | ICD-10-CM | POA: Diagnosis not present

## 2023-11-09 DIAGNOSIS — F419 Anxiety disorder, unspecified: Secondary | ICD-10-CM | POA: Diagnosis not present

## 2023-11-09 DIAGNOSIS — G479 Sleep disorder, unspecified: Secondary | ICD-10-CM | POA: Diagnosis not present

## 2023-11-09 DIAGNOSIS — Z6832 Body mass index (BMI) 32.0-32.9, adult: Secondary | ICD-10-CM | POA: Diagnosis not present

## 2023-11-14 ENCOUNTER — Other Ambulatory Visit: Payer: Self-pay | Admitting: Family Medicine

## 2023-12-15 ENCOUNTER — Encounter: Payer: Self-pay | Admitting: Family Medicine

## 2023-12-15 ENCOUNTER — Ambulatory Visit: Payer: BC Managed Care – PPO | Admitting: Family Medicine

## 2023-12-15 VITALS — BP 148/94 | HR 57 | Temp 97.8°F | Ht 67.0 in | Wt 216.0 lb

## 2023-12-15 DIAGNOSIS — Z1211 Encounter for screening for malignant neoplasm of colon: Secondary | ICD-10-CM

## 2023-12-15 DIAGNOSIS — F411 Generalized anxiety disorder: Secondary | ICD-10-CM | POA: Diagnosis not present

## 2023-12-15 DIAGNOSIS — E782 Mixed hyperlipidemia: Secondary | ICD-10-CM

## 2023-12-15 DIAGNOSIS — E230 Hypopituitarism: Secondary | ICD-10-CM

## 2023-12-15 DIAGNOSIS — K760 Fatty (change of) liver, not elsewhere classified: Secondary | ICD-10-CM

## 2023-12-15 DIAGNOSIS — Z0001 Encounter for general adult medical examination with abnormal findings: Secondary | ICD-10-CM

## 2023-12-15 DIAGNOSIS — G4733 Obstructive sleep apnea (adult) (pediatric): Secondary | ICD-10-CM

## 2023-12-15 DIAGNOSIS — R030 Elevated blood-pressure reading, without diagnosis of hypertension: Secondary | ICD-10-CM

## 2023-12-15 DIAGNOSIS — Z23 Encounter for immunization: Secondary | ICD-10-CM

## 2023-12-15 LAB — COMPREHENSIVE METABOLIC PANEL
ALT: 41 U/L (ref 0–53)
AST: 55 U/L — ABNORMAL HIGH (ref 0–37)
Albumin: 4.5 g/dL (ref 3.5–5.2)
Alkaline Phosphatase: 42 U/L (ref 39–117)
BUN: 17 mg/dL (ref 6–23)
CO2: 28 meq/L (ref 19–32)
Calcium: 9.6 mg/dL (ref 8.4–10.5)
Chloride: 98 meq/L (ref 96–112)
Creatinine, Ser: 0.81 mg/dL (ref 0.40–1.50)
GFR: 103.95 mL/min (ref >60.00)
Glucose, Bld: 68 mg/dL — ABNORMAL LOW (ref 70–99)
Potassium: 4.1 meq/L (ref 3.5–5.1)
Sodium: 135 meq/L (ref 135–145)
Total Bilirubin: 0.6 mg/dL (ref 0.2–1.2)
Total Protein: 7.3 g/dL (ref 6.0–8.3)

## 2023-12-15 LAB — CBC WITH DIFFERENTIAL/PLATELET
Basophils Absolute: 0 10*3/uL (ref 0.0–0.1)
Basophils Relative: 1 % (ref 0.0–3.0)
Eosinophils Absolute: 0.1 10*3/uL (ref 0.0–0.7)
Eosinophils Relative: 2.6 % (ref 0.0–5.0)
HCT: 48.3 % (ref 39.0–52.0)
Hemoglobin: 16.5 g/dL (ref 13.0–17.0)
Lymphocytes Relative: 24.4 % (ref 12.0–46.0)
Lymphs Abs: 1.1 10*3/uL (ref 0.7–4.0)
MCHC: 34.2 g/dL (ref 30.0–36.0)
MCV: 97.7 fL (ref 78.0–100.0)
Monocytes Absolute: 0.5 10*3/uL (ref 0.1–1.0)
Monocytes Relative: 12 % (ref 3.0–12.0)
Neutro Abs: 2.6 10*3/uL (ref 1.4–7.7)
Neutrophils Relative %: 60 % (ref 43.0–77.0)
Platelets: 236 10*3/uL (ref 150.0–400.0)
RBC: 4.94 Mil/uL (ref 4.22–5.81)
RDW: 13.1 % (ref 11.5–15.5)
WBC: 4.3 10*3/uL (ref 4.0–10.5)

## 2023-12-15 LAB — LIPID PANEL
Cholesterol: 162 mg/dL (ref 0–200)
HDL: 47.8 mg/dL (ref >39.00)
LDL Cholesterol: 76 mg/dL (ref 0–99)
NonHDL: 114.32
Total CHOL/HDL Ratio: 3
Triglycerides: 194 mg/dL — ABNORMAL HIGH (ref 0.0–149.0)
VLDL: 38.8 mg/dL (ref 0.0–40.0)

## 2023-12-15 LAB — TSH: TSH: 0.89 u[IU]/mL (ref 0.35–5.50)

## 2023-12-15 NOTE — Patient Instructions (Signed)
Please return in 12 months for your annual complete physical; please come fasting. If home BP is elevated, return in 4-6 weeks to recheck in office.   I will release your lab results to you on your MyChart account with further instructions. You may see the results before I do, but when I review them I will send you a message with my report or have my assistant call you if things need to be discussed. Please reply to my message with any questions. Thank you!   If you have any questions or concerns, please don't hesitate to send me a message via MyChart or call the office at [phone]. Thank you for visiting with [place] today! It's our pleasure caring for you.   VISIT SUMMARY:  During today's visit, we discussed your recent increase in blood pressure, your ongoing management of GERD, nicotine dependence, anxiety, hyperlipidemia, and weight loss. We also reviewed your general health maintenance and addressed your cosmetic concerns about the bags under your eyes.  YOUR PLAN:  -HYPERTENSION: Hypertension means high blood pressure. Your blood pressure is elevated today. Please start checking home readings and send me numbers. Return to office for recheck if remains elevated.  -GASTROESOPHAGEAL REFLUX DISEASE (GERD): GERD is a condition where stomach acid frequently flows back into the tube connecting your mouth and stomach. You are managing this with daily over-the-counter Nexium, and you should continue this regimen.  -NICOTINE DEPENDENCE: Nicotine dependence means you are addicted to nicotine, commonly from smoking. You continue to smoke one pack per day and have no desire to quit. We will discuss smoking cessation options in future visits.  -ANXIETY: Anxiety is a feeling of worry or fear that can be strong enough to interfere with daily activities. Your anxiety is well-controlled with daily Klonopin, and you should continue this medication. We will refill your prescription as  needed.  -HYPERLIPIDEMIA: Hyperlipidemia means you have high levels of fats in your blood. You are taking simvastatin regularly without any new issues, so you should continue this medication.  -WEIGHT LOSS: You have lost weight through dietary changes, which is positively impacting your overall health. We will continue to monitor your weight and dietary habits.  -GENERAL HEALTH MAINTENANCE: You are up to date on your Tdap and flu vaccinations. You are due for colon cancer screening in April 2025 and have chosen Cologuard. We will order Cologuard for you and review your liver function tests with your next blood work.  -COSMETIC CONCERNS: You are concerned about the bags under your eyes. We discussed options including fillers and plastic surgery. I recommend a plastic surgery consultation.  INSTRUCTIONS:  Please monitor your blood pressure regularly and continue your current medications as discussed. We will order blood work and review the results at your next visit. You are due for colon cancer screening in April 2025, and we will order Cologuard for you at that time. Additionally, we will refer you to a med spa for evaluation of fillers for your cosmetic concerns.

## 2023-12-15 NOTE — Progress Notes (Signed)
See mychart note Dear Shannon Ibarra, Your lab results are all stable.  Let me know how your blood pressure is doing at home in a few weeks with your home readings.   Enjoy your holidays! Sincerely, Dr. Mardelle Matte

## 2023-12-15 NOTE — Progress Notes (Signed)
Subjective  Chief Complaint  Patient presents with   Annual Exam    Pt here for Annual Exam and is currently fasting     HPI: Shannon Ibarra is a 47 y.o. male who presents to [facility] [place] today for a Male Wellness Visit. He also has the concerns and/or needs as listed above in the chief complaint. These will be addressed in addition to the Health Maintenance Visit.   Wellness Visit: annual visit with health maintenance review and exam   HM: has lost weight, down from 242 a year ago. Cooks for himself now and eating more fish and vegetables. Continues to happily smoke 1ppd. No sxs and no interest in quitting. Exercises. Drinks alcohol most nights of week.   Body mass index is 33.83 kg/m. Wt Readings from Last 3 Encounters:  12/15/23 216 lb (98 kg)  10/24/23 214 lb (97.1 kg)  06/20/23 214 lb 3.2 oz (97.2 kg)     Chronic disease management visit and/or acute problem visit: Discussed the use of AI scribe software for clinical note transcription with the patient, who gave verbal consent to proceed.  History of Present Illness   The patient, without a history of hypertension, presents with a recent increase in blood pressure. He denies any specific stressors or tension that could be contributing to this increase in blood pressure. He is smoking and drinking more alcohol now. Diet is improved. Denies stimulants. No palpitations or cp. Feels fine. Doesn't check bp at home.  The patient continues to smoke a pack of cigarettes per day and denies any desire to quit. He denies any coughing, shortness of breath, wheezing, or chest tightness. He is currently taking Nexium, which he purchases over the counter, for stomach issues. He also takes Klonopin daily for mood regulation and reports no issues with this medication.  Continues with testosterone injections. He denies any issues with his current testosterone injections, which are managed by [facility].   Simvastatin  nightly for HLD and due for recheck. Has been at goal.   Fatty liver w/o RUQ pain. Neg work up for other causes done last year and negative.      Assessment  1. Encounter for well adult exam with abnormal findings   2. Mixed hyperlipidemia   3. Obstructive sleep apnea syndrome, severe   4. GAD (generalized anxiety disorder)   5. Hepatic steatosis   6. Hypogonadotropic hypogonadism in male [facility])   7. Screening for colorectal cancer      Plan  Male Wellness Visit: Age appropriate Health Maintenance and Prevention measures were discussed with patient. Included topics are cancer screening recommendations, ways to keep healthy (see AVS) including dietary and exercise recommendations, regular eye and dental care, use of seat belts, and avoidance of moderate alcohol use and tobacco use. Cologuard ordered BMI: discussed patient's BMI and encouraged positive lifestyle modifications to help get to or maintain a target BMI. HM needs and immunizations were addressed and ordered. See below for orders. See HM and immunization section for updates. Flu and tdap given in office today Routine labs and screening tests ordered including cmp, cbc and lipids where appropriate. Discussed recommendations regarding Vit D and calcium supplementation (see AVS)  Chronic disease f/u and/or acute problem visit: (deemed necessary to be done in addition to the wellness visit): Assessment and Plan    Hypertension New onset of elevated blood pressure. Reports heart rate increases to around 100 bpm upon waking, normalizing to 62-64 bpm. No additional  stressors or symptoms reported. - Monitor blood pressure at home and send me readings over next 2 weeks - decrease smoking and alcohol intake - RTO if remains elevated to discuss management - maintain a low sodium diet  Gastroesophageal Reflux Disease (GERD) Continues daily over-the-counter Nexium with no new symptoms. - Continue current Nexium regimen  Nicotine  Dependence Continues to smoke one pack per day with no desire to quit. No significant respiratory symptoms reported. - Discuss smoking cessation options at future visits  Anxiety Well-controlled on daily Klonopin and paxil. Reports adequate symptom control. - Continue current Klonopin and paxilregimen - Refill Klonopin as needed  Hyperlipidemia Taking simvastatin regularly with no new issues. - Continue current simvastatin regimen - check lfts on statin and lipids in setting of fatty liver  Weight Loss Has lost weight through dietary changes, primarily eating fish and vegetables, avoiding red meat. Positive impact on overall health noted. - Monitor weight and dietary habits  General Health Maintenance Up to date on Tdap and flu vaccinations. Due for colon cancer screening in April 2025. Chose Cologuard for convenience despite shorter screening interval. - Order Cologuard for April 2025 - Review liver function tests with next blood work  Cosmetic Concerns Concern about prominent bags under eyes. Discussed options including fillers and plastic surgery. Advised that deep lines may require plastic surgery for optimal results. - Refer to med spa for evaluation of fillers - Consider plastic surgery consultation if needed Follow up: 12 mo for cpe, sooner if home bp readings are elevated  Commons side effects, risks, benefits, and alternatives for medications and treatment plan prescribed today were discussed, and the patient expressed understanding of the given instructions. Patient is instructed to call or message via MyChart if he/she has any questions or concerns regarding our treatment plan. No barriers to understanding were identified. We discussed Red Flag symptoms and signs in detail. Patient expressed understanding regarding what to do in case of urgent or emergency type symptoms.  Medication list was reconciled, printed and provided to the patient in AVS. Patient instructions and  summary information was reviewed with the patient as documented in the AVS. This note was prepared with assistance of Dragon voice recognition software. Occasional wrong-word or sound-a-like substitutions may have occurred due to the inherent limitations of voice recognition software  Orders Placed This Encounter  Procedures   CBC with Differential/Platelet   Comprehensive metabolic panel   Lipid panel   TSH   No orders of the defined types were placed in this encounter.    Patient Active Problem List   Diagnosis Date Noted   GAD (generalized anxiety disorder) 01/10/2018   Vestibular nerve damage 01/10/2018   Obstructive sleep apnea syndrome, severe 10/19/2016   Mixed hyperlipidemia 05/10/2008   Hepatic steatosis 10/19/2021   Irritable bowel syndrome with diarrhea 05/04/2018   Nonulcer dyspepsia 01/10/2018   Hypogonadotropic hypogonadism in male [facility]) 02/19/2014   Acne rosacea 06/07/2014   Drug-induced gynecomastia 02/19/2014   Allergic rhinitis 05/10/2008   Obesity (BMI 30-39.9) 11/29/2022   Nicotine dependence, cigarettes, uncomplicated 11/29/2022   Non-cardiac chest pain 01/10/2018   Health Maintenance  Topic Date Due   DTaP/Tdap/Td (3 - Td or Tdap) 12/06/2023   COVID-19 Vaccine (7 - 2024-25 season) 12/31/2023 (Originally 08/28/2023)   INFLUENZA VACCINE  03/26/2024 (Originally 07/28/2023)   Fecal DNA (Cologuard)  03/30/2024   Hepatitis C Screening  Completed   HIV Screening  Completed   HPV VACCINES  Aged Out   Immunization History  Administered Date(s)  Administered   Influenza Inj Mdck Quad With Preservative 10/05/2021   Influenza Split 10/17/2015   Influenza, Quadrivalent, Recombinant, Inj, Pf 09/01/2018   Influenza,inj,Quad PF,6+ Mos 11/03/2017, 09/12/2020, 10/05/2021, 09/10/2022   PFIZER(Purple Top)SARS-COV-2 Vaccination 03/05/2020, 03/26/2020, 10/13/2020   Pfizer Covid-19 Vaccine Bivalent Booster 4yrs & up 10/05/2021, 10/05/2021, 09/10/2022   Pfizer(Comirnaty)Fall  Seasonal Vaccine 12 years and older 09/28/2022   Td 12/27/2002   Tdap 12/05/2013   We updated and reviewed the patient's past history in detail and it is documented below. Allergies: Patient is allergic to nitroglycerin, bupropion, and dust mite extract. Past Medical History  has a past medical history of Acne, Allergic rhinitis, Anxiety, Fatty liver, Hyperlipidemia (11/2282013), Irritable bowel syndrome with diarrhea (05/04/2018), Nonulcer dyspepsia (01/10/2018), Rosacea, acne (06/07/2014), Vestibular nerve damage (01/10/2018), and Vestibular neuritis (10/13/2017). Past Surgical History Patient  has a past surgical history that includes Tonsillectomy. Social History Patient  reports that he quit smoking about 16 years ago. His smoking use included cigarettes. He started smoking about 28 years ago. He has a 12 pack-year smoking history. He has never used smokeless tobacco. He reports current alcohol use of about 2.0 standard drinks of alcohol per week. He reports that he does not use drugs. Family History family history includes Colon polyps in his father; Hypertension in his brother and mother; Throat cancer in his father. Review of Systems: Constitutional: negative for fever or malaise Ophthalmic: negative for photophobia, double vision or loss of vision Cardiovascular: negative for chest pain, dyspnea on exertion, or new LE swelling Respiratory: negative for SOB or persistent cough Gastrointestinal: negative for abdominal pain, change in bowel habits or melena Genitourinary: negative for dysuria or gross hematuria Musculoskeletal: negative for new gait disturbance or muscular weakness Integumentary: negative for new or persistent rashes Neurological: negative for TIA or stroke symptoms Psychiatric: negative for SI or delusions Allergic/Immunologic: negative for hives  Patient Care Team    Relationship Specialty Notifications Start End  Willow Ora, MD PCP - General Family Medicine   01/10/18   Marnette Burgess, MD  Gastroenterology  01/10/18   Warden Fillers, MD Consulting Physician Endocrinology  01/10/18   Randolm Idol, MD Consulting Physician Pulmonary Disease  01/10/18    Objective  Vitals: BP (!) 148/90   Pulse (!) 57   Temp 97.8 F (36.6 C)   Ht 5\' 7"  (1.702 m)   Wt 216 lb (98 kg)   SpO2 97%   BMI 33.83 kg/m  General:  Well developed, well nourished, no acute distress  Psych:  Alert and orientedx3,normal mood and affect HEENT:  Normocephalic, atraumatic, non-icteric sclera,  oropharynx is clear without mass or exudate, supple neck without adenopathy, or thyromegaly Cardiovascular:  Normal S1, S2, RRR without gallop, rub or murmur,  Respiratory:  Good breath sounds bilaterally, CTAB with normal respiratory effort Gastrointestinal: normal bowel sounds, soft, non-tender, no noted masses. No HSM MSK: Joints are without erythema or swelling.  Skin:  Warm, no rashes, large bags beneath eyes with deep folds and thin skin Neurologic:    Mental status is normal.  Gross motor and sensory exams are normal. Stable gait. No tremor

## 2023-12-30 ENCOUNTER — Encounter: Payer: Self-pay | Admitting: *Deleted

## 2023-12-30 ENCOUNTER — Encounter: Payer: Self-pay | Admitting: Family Medicine

## 2023-12-30 ENCOUNTER — Ambulatory Visit: Payer: Self-pay | Admitting: Family Medicine

## 2023-12-30 NOTE — Telephone Encounter (Addendum)
 Copied from CRM [id number]. Topic: Clinical - Pink Word Triage >> Dec 30, 2023 12:49 PM Tiffany H wrote: Reason for Triage: Patient called to advise that he had blood pressure reading of 198/98 at 10AM, then idled between 155/93 and 135/93. Please assist. Patient is scheduled to see Merlynn on Monday.   Chief Complaint: High blood pressure Symptoms: High blood pressure readings Frequency: Patient states this start December 19th at his last visit with Dr Jodie his PCP Pertinent Negatives: Patient denies any symptoms such as chest pain, difficulty, headaches, blurry vision, weakness, and physical manifestations that he can recall Disposition: [] ED /[] Urgent Care (no appt availability in office) / [x] Appointment(In office/virtual)/ []  [facility]/ [] Home Care/ [] Refused Recommended Disposition /[] [facility]/ []  Follow-up with PCP Additional Notes: Patient set up an appointment prior to triage and his appointment is set for Monday 01/02/24 at 8:40am at the [facility] location.   Patient called and advised that on December 19th 2024 he had a physical with his PCP and his blood pressure was elevated.  He was advised by his PCP to keep track of it over the holidays and let him know what it is.  Patient also states that he doesn't know if his blood pressure cuff is accurate.  He states he had one very high reading but then the readings were not that high but still higher than his normal.  He gave the readings of 162/86 139/82 134/101 150/111 and took it while on the phone with this RN and it was 127/88.  Patient is advised to try and keep his blood pressure cuff at the same level of his heart when checking the blood pressure and check it twice after being in the same position for a few minutes. Patient does admit to having a history of smoking cigarettes and drinking coffee and knows that these can raise blood pressure. Patient states he had a lot of coffee and cigarettes this morning when he had  that one high reading.  Patient denies any symptoms such as chest pain, difficulty breathing, headaches, blurry vision, weakness, or any other symptoms that he can recall.  His wife with him states that they can go check his blood pressure at [facility] on their machine.  Patient is advised to take his blood pressure cuff (which is an automatic cuff that goes on the wrist and he has consistently been using it on his left wrist).  Patient states that if it is high there he will go to an urgent care.  Patient is also advised that if he starts experiencing any symptoms to go to the emergency room for further evaluation.  Patient verbalized understanding.     Reason for Disposition  Systolic BP  >= 160 OR Diastolic >= 100  Answer Assessment - Initial Assessment Questions 1. BLOOD PRESSURE: What is the blood pressure? Did you take at least two measurements 5 minutes apart?     150/111 2. ONSET: When did you take your blood pressure?     Approximately 5 minutes ago-- 150/111 before that 134/101 before that 162/86 3. HOW: How did you take your blood pressure? (e.g., automatic home BP monitor, visiting nurse)     Automatic wrist cuff usually using left wrist 4. HISTORY: Do you have a history of high blood pressure?     Dec 15 2023 at his last visit was the first time he had this. 5. MEDICINES: Are you taking any medicines for blood pressure? Have you missed any doses  recently?     No 6. OTHER SYMPTOMS: Do you have any symptoms? (e.g., blurred vision, chest pain, difficulty breathing, headache, weakness)     No symptoms  Protocols used: Blood Pressure - High-A-AH

## 2023-12-30 NOTE — Telephone Encounter (Signed)
Please see pt call msg

## 2024-01-02 ENCOUNTER — Ambulatory Visit: Payer: BC Managed Care – PPO | Admitting: Family Medicine

## 2024-01-02 NOTE — Telephone Encounter (Signed)
 I have scheduled patient with Dr. Mardelle Matte for 1/7.  I have advised to go to ED if worsens.

## 2024-01-03 ENCOUNTER — Ambulatory Visit: Payer: BC Managed Care – PPO | Admitting: Family Medicine

## 2024-01-03 ENCOUNTER — Encounter: Payer: Self-pay | Admitting: Family Medicine

## 2024-01-03 VITALS — BP 132/82 | HR 62 | Temp 97.6°F | Resp 97 | Ht 67.0 in | Wt 215.4 lb

## 2024-01-03 DIAGNOSIS — F17210 Nicotine dependence, cigarettes, uncomplicated: Secondary | ICD-10-CM

## 2024-01-03 DIAGNOSIS — F411 Generalized anxiety disorder: Secondary | ICD-10-CM | POA: Diagnosis not present

## 2024-01-03 DIAGNOSIS — R030 Elevated blood-pressure reading, without diagnosis of hypertension: Secondary | ICD-10-CM

## 2024-01-03 MED ORDER — LISINOPRIL 2.5 MG PO TABS
2.5000 mg | ORAL_TABLET | Freq: Every day | ORAL | 3 refills | Status: DC
Start: 2024-01-03 — End: 2024-05-03

## 2024-01-03 NOTE — Patient Instructions (Addendum)
 Please return in 3 months to recheck your blood pressure.   If you have any questions or concerns, please don't hesitate to send me a message via MyChart or call the office at [phone]. Thank you for visiting with us  today! It's our pleasure caring for you.   VISIT SUMMARY:  Today, we discussed your concerns about fluctuating blood pressure readings. Given your family history of hypertension and current lifestyle habits, we identified prehypertension as a potential issue. We also talked about how stress and anxiety might be affecting your readings.  YOUR PLAN:  -PREHYPERTENSION: Prehypertension means your blood pressure is higher than normal but not yet in the high blood pressure range. It can be a warning sign for future hypertension. We discussed starting a low dose blood pressure pill once daily to help manage your blood pressure. You should recheck your blood pressure in three months. To avoid anxiety-related elevations, try not to monitor your blood pressure too frequently. Please contact us  if you experience any problems with the medication.  INSTRUCTIONS:  Please recheck your blood pressure in three months and contact us  if you experience any problems with the medication.

## 2024-01-03 NOTE — Progress Notes (Signed)
 Subjective  CC:  Chief Complaint  Patient presents with   Hyperlipidemia    Pt here for bp check - concerns of high bp reading s     HPI: Shannon Ibarra is a 48 y.o. male who presents to the office today to address the problems listed above in the chief complaint. Discussed the use of AI scribe software for clinical note transcription with the patient, who gave verbal consent to proceed.  History of Present Illness   The patient, with a family history of hypertension, presents with concerns about fluctuating blood pressure readings. He reports having taken multiple readings at home and at a local [facility], with systolic readings ranging from 100 to 156 and diastolic readings from 79 to 103. He has never had HTN before but BP was elevated last month at his physical. These readings have caused the patient significant anxiety, which may be contributing to the elevated readings. The patient admits to lifestyle habits such as smoking and drinking, which he is not willing to change. He also mentions frequent urination and bowel urgency, which he attributes to high fluid intake and a long-standing personal trait of urgency. The patient is currently involved in a stressful home renovation project, which may be contributing to his elevated blood pressure readings. No cp, sob leg edema, palpitation.   Lab Results  Component Value Date   NA 135 12/15/2023   CL 98 12/15/2023   K 4.1 12/15/2023   CO2 28 12/15/2023   BUN 17 12/15/2023   CREATININE 0.81 12/15/2023   GFR 103.95 12/15/2023   CALCIUM 9.6 12/15/2023   ALBUMIN 4.5 12/15/2023   GLUCOSE 68 (L) 12/15/2023        Assessment  1. Prehypertension   2. GAD (generalized anxiety disorder)   3. Nicotine  dependence, cigarettes, uncomplicated      Plan  Assessment and Plan    Prehypertension Variable blood pressure readings indicating prehypertension, possibly hypertension. Family history of hypertension. Smoking, alcohol use, and significant  stress are contributing factors. Emphasized importance of lifestyle modifications. Discussed medication options: diuretics vs. ACE inhibitors.  - Start low dose lisinopril  2.5mg  daily. Strong FH and given anxiety component, this will be reassuring and keep bp in normal range. - Recheck blood pressure in three months with blood work - Advise to avoid frequent self-monitoring to prevent anxiety-related elevations - Instruct to contact if any problems arise with the medication.   GAD Discussed how stress/anxiety afffect BP; also discussed that no matter the cause, keeping bp normal is important to prevent complications.   Tobacco cessation: precontemplative.     No orders of the defined types were placed in this encounter.  Meds ordered this encounter  Medications   lisinopril  (ZESTRIL ) 2.5 MG tablet    Sig: Take 1 tablet (2.5 mg total) by mouth daily. Stop medication if develop a cough    Dispense:  90 tablet    Refill:  3     I reviewed the patients updated PMH, FH, and SocHx.    Patient Active Problem List   Diagnosis Date Noted   GAD (generalized anxiety disorder) 01/10/2018    Priority: High   Vestibular nerve damage 01/10/2018    Priority: High   Obstructive sleep apnea syndrome, severe 10/19/2016    Priority: High   Mixed hyperlipidemia 05/10/2008    Priority: High   Hepatic steatosis 10/19/2021    Priority: Medium    Irritable bowel syndrome with diarrhea 05/04/2018    Priority: Medium  Nonulcer dyspepsia 01/10/2018    Priority: Medium    Hypogonadotropic hypogonadism in male [facility]) 02/19/2014    Priority: Medium    Acne rosacea 06/07/2014    Priority: Low   Drug-induced gynecomastia 02/19/2014    Priority: Low   Allergic rhinitis 05/10/2008    Priority: Low   Obesity (BMI 30-39.9) 11/29/2022   Nicotine  dependence, cigarettes, uncomplicated 11/29/2022   Non-cardiac chest pain 01/10/2018   Current Meds  Medication Sig   cetirizine (ZYRTEC) 10 MG tablet Take  10 mg by mouth daily.   cholecalciferol (VITAMIN D3) 25 MCG (1000 UNIT) tablet Take 1,000 Units by mouth daily.   clonazePAM  (KLONOPIN ) 0.5 MG tablet Take 1 tablet (0.5 mg total) by mouth daily.   esomeprazole  (NEXIUM ) 20 MG capsule Take 1 capsule (20 mg total) by mouth daily.   ketoconazole  (NIZORAL ) 2 % shampoo Apply topically 2 (two) times a week. As needed   lisinopril  (ZESTRIL ) 2.5 MG tablet Take 1 tablet (2.5 mg total) by mouth daily. Stop medication if develop a cough   Omega-3 Fatty Acids (FISH OIL ) 1000 MG CAPS Take 1 capsule (1,000 mg total) by mouth 2 (two) times daily.   PARoxetine  (PAXIL ) 20 MG tablet TAKE 1 TABLET DAILY   simvastatin  (ZOCOR ) 20 MG tablet TAKE 1 TABLET AT BEDTIME   testosterone  cypionate (DEPOTESTOSTERONE CYPIONATE) 200 MG/ML injection Inject into the muscle.   valACYclovir  (VALTREX ) 1000 MG tablet Take 2 tablets (2,000 mg total) by mouth 2 (two) times daily. Once, as needed for cold sores    Allergies: Patient is allergic to nitroglycerin, bupropion, and dust mite extract. Family History: Patient family history includes Colon polyps in his father; Hypertension in his brother and mother; Throat cancer in his father. Social History:  Patient  reports that he quit smoking about 16 years ago. His smoking use included cigarettes. He started smoking about 28 years ago. He has a 12 pack-year smoking history. He has never used smokeless tobacco. He reports current alcohol use of about 2.0 standard drinks of alcohol per week. He reports that he does not use drugs.  Review of Systems: Constitutional: Negative for fever malaise or anorexia Cardiovascular: negative for chest pain Respiratory: negative for SOB or persistent cough Gastrointestinal: negative for abdominal pain  Objective  Vitals: BP 132/82 (Cuff Size: Large) Comment: right arm seated/cla  Pulse 62   Temp 97.6 F (36.4 C)   Resp (!) 97   Ht 5' 7 (1.702 m)   Wt 215 lb 6.4 oz (97.7 kg)   BMI 33.74  kg/m  General: no acute distress , A&Ox3 HEENT: PEERL, conjunctiva normal, neck is supple Cardiovascular:  RRR without murmur or gallop.  Respiratory:  Good breath sounds bilaterally, CTAB with normal respiratory effort   Commons side effects, risks, benefits, and alternatives for medications and treatment plan prescribed today were discussed, and the patient expressed understanding of the given instructions. Patient is instructed to call or message via MyChart if he/she has any questions or concerns regarding our treatment plan. No barriers to understanding were identified. We discussed Red Flag symptoms and signs in detail. Patient expressed understanding regarding what to do in case of urgent or emergency type symptoms.  Medication list was reconciled, printed and provided to the patient in AVS. Patient instructions and summary information was reviewed with the patient as documented in the AVS. This note was prepared with assistance of Dragon voice recognition software. Occasional wrong-word or sound-a-like substitutions may have occurred due to the inherent limitations of  voice recognition software

## 2024-02-06 DIAGNOSIS — E291 Testicular hypofunction: Secondary | ICD-10-CM | POA: Diagnosis not present

## 2024-02-06 DIAGNOSIS — Z79890 Hormone replacement therapy (postmenopausal): Secondary | ICD-10-CM | POA: Diagnosis not present

## 2024-02-06 DIAGNOSIS — R768 Other specified abnormal immunological findings in serum: Secondary | ICD-10-CM | POA: Diagnosis not present

## 2024-02-16 ENCOUNTER — Encounter: Payer: Self-pay | Admitting: Family Medicine

## 2024-02-17 MED ORDER — SIMVASTATIN 20 MG PO TABS: 20.0000 mg | ORAL_TABLET | Freq: Every day | ORAL | 3 refills | Status: DC

## 2024-03-14 ENCOUNTER — Encounter: Payer: Self-pay | Admitting: Family Medicine

## 2024-03-14 MED ORDER — CLONAZEPAM 0.5 MG PO TABS: 0.5000 mg | ORAL_TABLET | Freq: Every day | ORAL | 1 refills | Status: DC

## 2024-04-02 ENCOUNTER — Encounter: Payer: Self-pay | Admitting: Family Medicine

## 2024-04-08 DIAGNOSIS — Z1212 Encounter for screening for malignant neoplasm of rectum: Secondary | ICD-10-CM | POA: Diagnosis not present

## 2024-04-13 ENCOUNTER — Encounter: Payer: Self-pay | Admitting: Family Medicine

## 2024-04-14 LAB — COLOGUARD: COLOGUARD: NEGATIVE

## 2024-04-16 ENCOUNTER — Encounter: Payer: Self-pay | Admitting: Family Medicine

## 2024-04-16 NOTE — Progress Notes (Signed)
Negative cologuard. Mc note sent

## 2024-04-19 MED ORDER — KETOCONAZOLE 2 % EX SHAM: MEDICATED_SHAMPOO | CUTANEOUS | 3 refills | Status: DC

## 2024-04-29 ENCOUNTER — Encounter: Payer: Self-pay | Admitting: Family Medicine

## 2024-05-01 DIAGNOSIS — R768 Other specified abnormal immunological findings in serum: Secondary | ICD-10-CM | POA: Diagnosis not present

## 2024-05-01 DIAGNOSIS — E291 Testicular hypofunction: Secondary | ICD-10-CM | POA: Diagnosis not present

## 2024-05-01 DIAGNOSIS — Z79890 Hormone replacement therapy (postmenopausal): Secondary | ICD-10-CM | POA: Diagnosis not present

## 2024-05-02 ENCOUNTER — Encounter: Payer: Self-pay | Admitting: Family Medicine

## 2024-05-03 ENCOUNTER — Other Ambulatory Visit: Payer: Self-pay

## 2024-05-03 DIAGNOSIS — R768 Other specified abnormal immunological findings in serum: Secondary | ICD-10-CM | POA: Diagnosis not present

## 2024-05-03 DIAGNOSIS — G479 Sleep disorder, unspecified: Secondary | ICD-10-CM | POA: Diagnosis not present

## 2024-05-03 DIAGNOSIS — F419 Anxiety disorder, unspecified: Secondary | ICD-10-CM | POA: Diagnosis not present

## 2024-05-03 DIAGNOSIS — E291 Testicular hypofunction: Secondary | ICD-10-CM | POA: Diagnosis not present

## 2024-05-03 MED ORDER — FISH OIL 1000 MG PO CAPS: 1000.0000 mg | ORAL_CAPSULE | Freq: Two times a day (BID) | ORAL | Status: AC

## 2024-05-03 MED ORDER — CLONAZEPAM 0.5 MG PO TABS: 0.5000 mg | ORAL_TABLET | Freq: Every day | ORAL | 1 refills | Status: DC

## 2024-05-03 MED ORDER — LISINOPRIL 2.5 MG PO TABS
2.5000 mg | ORAL_TABLET | Freq: Every day | ORAL | 3 refills | Status: DC
Start: 2024-05-03 — End: 2024-06-09

## 2024-05-03 MED ORDER — KETOCONAZOLE 2 % EX SHAM: MEDICATED_SHAMPOO | CUTANEOUS | 3 refills | Status: AC

## 2024-05-03 MED ORDER — ESOMEPRAZOLE MAGNESIUM 20 MG PO CPDR: 20.0000 mg | DELAYED_RELEASE_CAPSULE | Freq: Every day | ORAL | Status: AC

## 2024-05-03 MED ORDER — PAROXETINE HCL 20 MG PO TABS: 20.0000 mg | ORAL_TABLET | Freq: Every day | ORAL | 3 refills | Status: DC

## 2024-05-03 MED ORDER — SIMVASTATIN 20 MG PO TABS: 20.0000 mg | ORAL_TABLET | Freq: Every day | ORAL | 3 refills | Status: DC

## 2024-06-06 DIAGNOSIS — E66811 Obesity, class 1: Secondary | ICD-10-CM | POA: Diagnosis not present

## 2024-06-06 DIAGNOSIS — J3089 Other allergic rhinitis: Secondary | ICD-10-CM | POA: Diagnosis not present

## 2024-06-06 DIAGNOSIS — G4733 Obstructive sleep apnea (adult) (pediatric): Secondary | ICD-10-CM | POA: Diagnosis not present

## 2024-06-06 DIAGNOSIS — E6609 Other obesity due to excess calories: Secondary | ICD-10-CM | POA: Diagnosis not present

## 2024-06-08 ENCOUNTER — Other Ambulatory Visit: Payer: Self-pay | Admitting: Family Medicine

## 2024-07-17 DIAGNOSIS — E291 Testicular hypofunction: Secondary | ICD-10-CM | POA: Diagnosis not present

## 2024-07-17 DIAGNOSIS — R768 Other specified abnormal immunological findings in serum: Secondary | ICD-10-CM | POA: Diagnosis not present

## 2024-08-22 ENCOUNTER — Ambulatory Visit: Admitting: Family Medicine

## 2024-09-09 ENCOUNTER — Other Ambulatory Visit: Payer: Self-pay | Admitting: Family Medicine

## 2024-09-18 ENCOUNTER — Other Ambulatory Visit: Payer: Self-pay

## 2024-09-18 ENCOUNTER — Encounter: Payer: Self-pay | Admitting: Family Medicine

## 2024-09-18 MED ORDER — SIMVASTATIN 20 MG PO TABS: 20.0000 mg | ORAL_TABLET | Freq: Every day | ORAL | 3 refills | Status: AC

## 2024-11-16 ENCOUNTER — Encounter: Payer: Self-pay | Admitting: Family Medicine

## 2024-11-19 MED ORDER — PAROXETINE HCL 20 MG PO TABS: 20.0000 mg | ORAL_TABLET | Freq: Every day | ORAL | 3 refills | Status: AC
